# Patient Record
Sex: Female | Born: 1955
Health system: Southern US, Community
[De-identification: ages and names within clinical notes are randomized; demographics above are authoritative.]

## PROBLEM LIST (undated history)

## (undated) DIAGNOSIS — Z952 Presence of prosthetic heart valve: Secondary | ICD-10-CM

## (undated) DIAGNOSIS — I340 Nonrheumatic mitral (valve) insufficiency: Secondary | ICD-10-CM

## (undated) DIAGNOSIS — G4700 Insomnia, unspecified: Secondary | ICD-10-CM

## (undated) DIAGNOSIS — I10 Essential (primary) hypertension: Secondary | ICD-10-CM

## (undated) DIAGNOSIS — I471 Supraventricular tachycardia: Secondary | ICD-10-CM

## (undated) DIAGNOSIS — R000 Tachycardia, unspecified: Secondary | ICD-10-CM

## (undated) DIAGNOSIS — F419 Anxiety disorder, unspecified: Secondary | ICD-10-CM

## (undated) DIAGNOSIS — M1990 Unspecified osteoarthritis, unspecified site: Secondary | ICD-10-CM

## (undated) DIAGNOSIS — I341 Nonrheumatic mitral (valve) prolapse: Secondary | ICD-10-CM

## (undated) DIAGNOSIS — M8580 Other specified disorders of bone density and structure, unspecified site: Secondary | ICD-10-CM

## (undated) HISTORY — DX: Nonrheumatic mitral (valve) prolapse: I34.1

## (undated) HISTORY — DX: Supraventricular tachycardia: I47.1

## (undated) HISTORY — DX: Essential (primary) hypertension: I10

## (undated) HISTORY — DX: Unspecified osteoarthritis, unspecified site: M19.90

## (undated) HISTORY — DX: Other specified disorders of bone density and structure, unspecified site: M85.80

## (undated) HISTORY — DX: Presence of prosthetic heart valve: Z95.2

## (undated) HISTORY — PX: BREAST SURGERY: SHX581

## (undated) HISTORY — DX: Insomnia, unspecified: G47.00

## (undated) HISTORY — PX: AUGMENTATION MAMMAPLASTY: SUR837

---

## 1998-10-19 ENCOUNTER — Other Ambulatory Visit: Admission: RE | Admit: 1998-10-19 | Discharge: 1998-10-19 | Payer: Self-pay | Admitting: Obstetrics & Gynecology

## 1999-11-08 ENCOUNTER — Other Ambulatory Visit: Admission: RE | Admit: 1999-11-08 | Discharge: 1999-11-08 | Payer: Self-pay | Admitting: Obstetrics and Gynecology

## 2000-10-14 ENCOUNTER — Other Ambulatory Visit: Admission: RE | Admit: 2000-10-14 | Discharge: 2000-10-14 | Payer: Self-pay | Admitting: *Deleted

## 2001-11-05 ENCOUNTER — Other Ambulatory Visit: Admission: RE | Admit: 2001-11-05 | Discharge: 2001-11-05 | Payer: Self-pay | Admitting: *Deleted

## 2002-11-25 ENCOUNTER — Other Ambulatory Visit: Admission: RE | Admit: 2002-11-25 | Discharge: 2002-11-25 | Payer: Self-pay | Admitting: *Deleted

## 2003-12-19 ENCOUNTER — Other Ambulatory Visit: Admission: RE | Admit: 2003-12-19 | Discharge: 2003-12-19 | Payer: Self-pay | Admitting: *Deleted

## 2005-01-30 ENCOUNTER — Encounter: Admission: RE | Admit: 2005-01-30 | Discharge: 2005-01-30 | Payer: Self-pay | Admitting: Internal Medicine

## 2006-05-05 ENCOUNTER — Encounter: Admission: RE | Admit: 2006-05-05 | Discharge: 2006-05-05 | Payer: Self-pay | Admitting: Obstetrics & Gynecology

## 2007-05-20 ENCOUNTER — Encounter: Admission: RE | Admit: 2007-05-20 | Discharge: 2007-05-20 | Payer: Self-pay | Admitting: Internal Medicine

## 2008-04-27 ENCOUNTER — Ambulatory Visit (HOSPITAL_BASED_OUTPATIENT_CLINIC_OR_DEPARTMENT_OTHER): Admission: RE | Admit: 2008-04-27 | Discharge: 2008-04-27 | Payer: Self-pay | Admitting: Orthopedic Surgery

## 2008-05-20 ENCOUNTER — Encounter: Admission: RE | Admit: 2008-05-20 | Discharge: 2008-05-20 | Payer: Self-pay | Admitting: Obstetrics & Gynecology

## 2008-07-22 ENCOUNTER — Encounter (INDEPENDENT_AMBULATORY_CARE_PROVIDER_SITE_OTHER): Payer: Self-pay | Admitting: Cardiology

## 2008-07-22 ENCOUNTER — Ambulatory Visit (HOSPITAL_COMMUNITY): Admission: RE | Admit: 2008-07-22 | Discharge: 2008-07-22 | Payer: Self-pay | Admitting: Cardiology

## 2008-08-19 HISTORY — PX: MITRAL VALVE REPAIR: SHX2039

## 2009-05-22 ENCOUNTER — Encounter: Admission: RE | Admit: 2009-05-22 | Discharge: 2009-05-22 | Payer: Self-pay | Admitting: Obstetrics & Gynecology

## 2010-05-28 ENCOUNTER — Encounter: Admission: RE | Admit: 2010-05-28 | Discharge: 2010-05-28 | Payer: Self-pay | Admitting: Obstetrics & Gynecology

## 2010-09-09 ENCOUNTER — Encounter: Payer: Self-pay | Admitting: Obstetrics & Gynecology

## 2010-09-10 ENCOUNTER — Encounter: Payer: Self-pay | Admitting: Obstetrics & Gynecology

## 2011-01-01 NOTE — Op Note (Signed)
[name], April Robertson               ACCOUNT NO.:  [id number]   MEDICAL RECORD NO.:  [id number]          PATIENT TYPE:  AMB   LOCATION:  DSC                          FACILITY:  [facility]   PHYSICIAN:  Harvie Junior, M.D.   DATE OF BIRTH:  03-Sep-1955   DATE OF PROCEDURE:  04/27/2008  DATE OF DISCHARGE:                               OPERATIVE REPORT   PREOPERATIVE DIAGNOSES:  1. Patellofemoral degenerative joint disease with suspected medial      meniscal tear.  2. Impingement of right shoulder.   POSTOPERATIVE DIAGNOSES:  1. Medial meniscal tear.  2. Severe chondromalacia of the medial femoral condyle, medial tibial      plateau.  3. Cartilaginous loose body.  4. Severe chondromalacia of the patellofemoral joint.   OPERATIONS AND PROCEDURES:  1. Partial posterior horn medial meniscectomy with corresponding      debridement in the medial compartment.  2. Chondroplasty of the patellofemoral joint including the trochlea      and the anterior aspect of the femoral condyle.  3. Removal of osteocartilaginous loose body.  4. Injection of 6 of Xylocaine, 2 of Depo-Medrol into the right      subacromial space.   SURGEON:  Harvie Junior, MD   ASSISTANT:  Marshia Ly, PA   ANESTHESIA:  General.   BRIEF HISTORY:  The patient is a 55 year old female with long history  having had significant pain in the right knee.  We evaluated previously  noted to have significant chondromalacia issues with range of motion of  the knee.  She had come back to [place] because she had a locking episode in  her knee and was suspected that she had a loose body.  We talked about  treatment options and we felt the most appropriate course of action was  going to be debridement of the knee arthroscopically and she was brought  to the operating room for this procedure.   PROCEDURE:  The patient brought to the operating room after adequate  anesthesia was obtained under general anesthetic.  The patient was  placed  supine on the operating table.  The right leg was then prepped  and draped in usual sterile fashion.  Following this, routine  arthroscopy examination of the knee revealed that there was an obvious  chondromalacia of the medial patella facet as well as the medial femoral  condyle in the patellofemoral trochlea.  The grade IV issues were seen  significantly over that area and this was debrided back to smooth and  stable rim.  Attention was then turned on to medial compartment, there  was a large medial shelf plica, which was debrided.  Attention was  further turned on the medial compartment where there was fairly  significant wear on the medial femoral condyle as well as an area of  grade IV change in the medial tibial plateau, which were debrided.  There was a posterior horn medial meniscal tear back at the meniscal  root and this was debrided.  Once this was completed, attention was  turned towards the ACL, which was normal lateral side showed no  degenerative changes.  At this point, attention was turned back to  medial compartment a large cartilaginous loose body came into the knee  it was about 5-mm in size and this was removed with a grasper and taken  to the back table.  The knee was then again thoroughly and copiously  irrigated and then suction dry.  Final check was made for loosening  fragment and pieces, seen none.  The knee was thoroughly irrigated one  final time, and the patient was then taken to recovery and noted to be  satisfactory condition.  The portals were closed with a bandage.  Sterile compression dressing was applied prior to being taken to the  recovery room.      Harvie Junior, M.D.  Electronically Signed     JLG/MEDQ  D:  04/27/2008  T:  04/28/2008  Job:  [id number]

## 2011-04-24 ENCOUNTER — Other Ambulatory Visit: Payer: Self-pay | Admitting: Obstetrics & Gynecology

## 2011-04-24 DIAGNOSIS — Z1231 Encounter for screening mammogram for malignant neoplasm of breast: Secondary | ICD-10-CM

## 2011-05-22 LAB — BASIC METABOLIC PANEL
BUN: 18
CO2: 23
Calcium: 9.4
Chloride: 100
Creatinine, Ser: 0.68
GFR calc Af Amer: >60
GFR calc non Af Amer: >60
Glucose, Bld: 98
Potassium: 4.2
Sodium: 134 — ABNORMAL LOW

## 2011-05-22 LAB — POCT HEMOGLOBIN-HEMACUE: Hemoglobin: 13.3

## 2011-05-30 ENCOUNTER — Ambulatory Visit
Admission: RE | Admit: 2011-05-30 | Discharge: 2011-05-30 | Disposition: A | Discharge status: Home / Self Care | Payer: BC Managed Care – PPO | Source: Ambulatory Visit | Attending: Obstetrics & Gynecology | Admitting: Obstetrics & Gynecology

## 2011-05-30 DIAGNOSIS — Z1231 Encounter for screening mammogram for malignant neoplasm of breast: Secondary | ICD-10-CM

## 2012-05-04 ENCOUNTER — Other Ambulatory Visit: Payer: Self-pay | Admitting: Orthopaedic Surgery

## 2012-05-04 DIAGNOSIS — M549 Dorsalgia, unspecified: Secondary | ICD-10-CM

## 2012-05-05 ENCOUNTER — Other Ambulatory Visit: Payer: Self-pay | Admitting: Obstetrics & Gynecology

## 2012-05-05 DIAGNOSIS — Z1231 Encounter for screening mammogram for malignant neoplasm of breast: Secondary | ICD-10-CM

## 2012-05-06 ENCOUNTER — Ambulatory Visit
Admission: RE | Admit: 2012-05-06 | Discharge: 2012-05-06 | Disposition: A | Discharge status: Home / Self Care | Payer: PRIVATE HEALTH INSURANCE | Source: Ambulatory Visit | Attending: Orthopaedic Surgery | Admitting: Orthopaedic Surgery

## 2012-05-06 DIAGNOSIS — M549 Dorsalgia, unspecified: Secondary | ICD-10-CM

## 2012-06-02 ENCOUNTER — Ambulatory Visit
Admission: RE | Admit: 2012-06-02 | Discharge: 2012-06-02 | Disposition: A | Discharge status: Home / Self Care | Payer: PRIVATE HEALTH INSURANCE | Source: Ambulatory Visit | Attending: Obstetrics & Gynecology | Admitting: Obstetrics & Gynecology

## 2012-06-02 DIAGNOSIS — Z1231 Encounter for screening mammogram for malignant neoplasm of breast: Secondary | ICD-10-CM

## 2012-10-30 ENCOUNTER — Other Ambulatory Visit: Payer: Self-pay | Admitting: Orthopedic Surgery

## 2012-11-03 ENCOUNTER — Other Ambulatory Visit: Payer: Self-pay | Admitting: Orthopedic Surgery

## 2012-11-13 NOTE — Pre-Procedure Instructions (Signed)
Tiaja Hagan  11/13/2012   Your procedure is scheduled on:  Monday, April 7th  Report to [facility] at 1030 AM.  Call this number if you have problems the morning of surgery: [phone]   Remember:   Do not eat food or drink liquids after midnight.    Take these medicines the morning of surgery with A SIP OF WATER:   Do not wear jewelry, make-up or nail polish.  Do not wear lotions, powders, or perfumes,deodorant.  Do not shave 48 hours prior to surgery. Men may shave face and neck.  Do not bring valuables to the hospital.  Contacts, dentures or bridgework may not be worn into surgery.  Leave suitcase in the car. After surgery it may be brought to your room.  For patients admitted to the hospital, checkout time is 11:00 AM the day of discharge.   Patients discharged the day of surgery will not be allowed to drive home.    Special Instructions: Shower using CHG 2 nights before surgery and the night before surgery.  If you shower the day of surgery use CHG.  Use special wash - you have one bottle of CHG for all showers.  You should use approximately 1/3 of the bottle for each shower.   Please read over the following fact sheets that you were given: Pain Booklet, Coughing and Deep Breathing, Blood Transfusion Information, MRSA Information and Surgical Site Infection Prevention

## 2012-11-16 ENCOUNTER — Ambulatory Visit (HOSPITAL_COMMUNITY)
Admission: RE | Admit: 2012-11-16 | Discharge: 2012-11-16 | Disposition: A | Discharge status: Home / Self Care | Payer: Managed Care, Other (non HMO) | Source: Ambulatory Visit | Attending: Orthopedic Surgery | Admitting: Orthopedic Surgery

## 2012-11-16 ENCOUNTER — Encounter (HOSPITAL_COMMUNITY): Payer: Self-pay | Admitting: Pharmacy Technician

## 2012-11-16 ENCOUNTER — Encounter (HOSPITAL_COMMUNITY)
Admission: RE | Admit: 2012-11-16 | Discharge: 2012-11-16 | Disposition: A | Discharge status: Home / Self Care | Payer: Managed Care, Other (non HMO) | Source: Ambulatory Visit | Attending: Orthopedic Surgery | Admitting: Orthopedic Surgery

## 2012-11-16 ENCOUNTER — Encounter (HOSPITAL_COMMUNITY): Payer: Self-pay

## 2012-11-16 DIAGNOSIS — I10 Essential (primary) hypertension: Secondary | ICD-10-CM | POA: Insufficient documentation

## 2012-11-16 DIAGNOSIS — Z01818 Encounter for other preprocedural examination: Secondary | ICD-10-CM | POA: Insufficient documentation

## 2012-11-16 DIAGNOSIS — Z01810 Encounter for preprocedural cardiovascular examination: Secondary | ICD-10-CM | POA: Insufficient documentation

## 2012-11-16 DIAGNOSIS — F411 Generalized anxiety disorder: Secondary | ICD-10-CM | POA: Insufficient documentation

## 2012-11-16 DIAGNOSIS — Z01812 Encounter for preprocedural laboratory examination: Secondary | ICD-10-CM | POA: Insufficient documentation

## 2012-11-16 HISTORY — DX: Tachycardia, unspecified: R00.0

## 2012-11-16 HISTORY — DX: Nonrheumatic mitral (valve) insufficiency: I34.0

## 2012-11-16 HISTORY — DX: Anxiety disorder, unspecified: F41.9

## 2012-11-16 LAB — COMPREHENSIVE METABOLIC PANEL WITH GFR
ALT: 30 U/L (ref 0–35)
AST: 34 U/L (ref 0–37)
Albumin: 4 g/dL (ref 3.5–5.2)
Alkaline Phosphatase: 80 U/L (ref 39–117)
BUN: 21 mg/dL (ref 6–23)
CO2: 28 meq/L (ref 19–32)
Calcium: 9.5 mg/dL (ref 8.4–10.5)
Chloride: 99 meq/L (ref 96–112)
Creatinine, Ser: 0.6 mg/dL (ref 0.50–1.10)
GFR calc Af Amer: >90 mL/min
GFR calc non Af Amer: >90 mL/min
Glucose, Bld: 95 mg/dL (ref 70–99)
Potassium: 3.6 meq/L (ref 3.5–5.1)
Sodium: 138 meq/L (ref 135–145)
Total Bilirubin: 0.7 mg/dL (ref 0.3–1.2)
Total Protein: 7.5 g/dL (ref 6.0–8.3)

## 2012-11-16 LAB — SURGICAL PCR SCREEN
MRSA, PCR: NEGATIVE
Staphylococcus aureus: NEGATIVE

## 2012-11-16 LAB — ABO/RH: ABO/RH(D): O POS

## 2012-11-16 LAB — URINALYSIS, ROUTINE W REFLEX MICROSCOPIC
Bilirubin Urine: NEGATIVE
Glucose, UA: NEGATIVE mg/dL
Ketones, ur: NEGATIVE mg/dL
Nitrite: POSITIVE — AB
Protein, ur: NEGATIVE mg/dL
Specific Gravity, Urine: 1.024 (ref 1.005–1.030)
Urobilinogen, UA: 1 mg/dL (ref 0.0–1.0)
pH: 5.5 (ref 5.0–8.0)

## 2012-11-16 LAB — CBC WITH DIFFERENTIAL/PLATELET
Basophils Absolute: 0 K/uL (ref 0.0–0.1)
Basophils Relative: 1 % (ref 0–1)
Eosinophils Absolute: 0.1 K/uL (ref 0.0–0.7)
Eosinophils Relative: 1 % (ref 0–5)
HCT: 38.8 % (ref 36.0–46.0)
Hemoglobin: 13.6 g/dL (ref 12.0–15.0)
Lymphocytes Relative: 42 % (ref 12–46)
Lymphs Abs: 2.7 K/uL (ref 0.7–4.0)
MCH: 34.4 pg — ABNORMAL HIGH (ref 26.0–34.0)
MCHC: 35.1 g/dL (ref 30.0–36.0)
MCV: 98.2 fL (ref 78.0–100.0)
Monocytes Absolute: 0.6 K/uL (ref 0.1–1.0)
Monocytes Relative: 9 % (ref 3–12)
Neutro Abs: 3 K/uL (ref 1.7–7.7)
Neutrophils Relative %: 47 % (ref 43–77)
Platelets: 206 K/uL (ref 150–400)
RBC: 3.95 MIL/uL (ref 3.87–5.11)
RDW: 13.1 % (ref 11.5–15.5)
WBC: 6.3 K/uL (ref 4.0–10.5)

## 2012-11-16 LAB — URINE MICROSCOPIC-ADD ON

## 2012-11-16 LAB — PROTIME-INR
INR: 0.95 (ref 0.00–1.49)
Prothrombin Time: 12.6 s (ref 11.6–15.2)

## 2012-11-16 LAB — TYPE AND SCREEN
ABO/RH(D): O POS
Antibody Screen: NEGATIVE

## 2012-11-16 LAB — APTT: aPTT: 32 seconds (ref 24–37)

## 2012-11-16 NOTE — Progress Notes (Signed)
Primary Physician - Dr. Kirby Funk Cardiologist - Dr. Cloria Spring, echo, ekg, last office visit - will request records

## 2012-11-17 NOTE — Progress Notes (Signed)
Anesthesia chart review: Patient is a 57 year old female scheduled for right TKA by Dr. Luiz Blare on 11/23/2012.  History includes non-smoker, anxiety, tachycardia (SVT), MVP s/p complex MV repair '10 (Glower ring placement with 2 artificial cord repairs).  Cardiology notes also mention a history of HTN. PCP is Dr. Kirby Funk.  Cardiologist is Dr. Verdis Prime.  He is aware of planned procedure and stated, "No significant cardiac concerns for the proposed right total knee replacement."  EKG on 11/16/12 showed NSR, possible LAE, septal infarct (age undetermined), borderline LAD.  Borderline LAD is new, but otherwise I think her EKG is stable since 05/13/12 Deboraha Sprang).  Echo on 05/16/10 Deboraha Sprang) showed: Normal wall thickness, chamber size, and contraction. Left ventricular ejection fraction estimated at 78%. Moderate left atrial enlargement. Stable repair, mitral annular ring in place. Mild to moderate mitral valve regurgitation. Mild tricuspid regurgitation.  Cardiac cath on 09/09/08 (Dr. Corliss Marcus) showed: Normal right heart pressures. Normal cardiac outputs. Severe mitral regurgitation. Hyperdynamic LV systolic function. EF 75-80%. Normal coronary arteries.  CXR report on 11/16/12 showed: Surgical clips right upper lobe. Surgical wire device in the region of the mitral valve. Heart size is normal. Negative for heart failure. Lungs are clear without infiltrate or effusion. Negative for mass lesion. IMPRESSION: Postop changes. No acute abnormality.  Pre-operative labs noted.  Preliminary urine culture showed E. Coli--results called to Darl Pikes at Dr. Luiz Blare' office.  Defer treatment recommendations to Dr. Luiz Blare.  If no acute changes then would anticipate she would be okay to proceed from an anesthesia standpoint.  She will be evaluated by her assigned anesthesiologist on the day of surgery.  Velna [facility]/Anesthesiology Phone [phone] 11/17/2012 11:19 AM

## 2012-11-18 LAB — URINE CULTURE: Colony Count: >=100000

## 2012-11-22 MED ORDER — CEFAZOLIN SODIUM-DEXTROSE 2-3 GM-% IV SOLR
2.0000 g | INTRAVENOUS | Status: AC
  Administered 2012-11-23: 2 g via INTRAVENOUS
  Filled 2012-11-22: qty 50

## 2012-11-23 ENCOUNTER — Encounter (HOSPITAL_COMMUNITY): Payer: Self-pay | Admitting: *Deleted

## 2012-11-23 ENCOUNTER — Encounter (HOSPITAL_COMMUNITY): Payer: Self-pay | Admitting: Vascular Surgery

## 2012-11-23 ENCOUNTER — Ambulatory Visit (HOSPITAL_COMMUNITY): Payer: Managed Care, Other (non HMO) | Admitting: Anesthesiology

## 2012-11-23 ENCOUNTER — Inpatient Hospital Stay (HOSPITAL_COMMUNITY)
Admission: RE | Admit: 2012-11-23 | Discharge: 2012-11-25 | DRG: 470 | Disposition: A | Discharge status: Home / Self Care | Payer: Managed Care, Other (non HMO) | Source: Ambulatory Visit | Attending: Orthopedic Surgery | Admitting: Orthopedic Surgery

## 2012-11-23 ENCOUNTER — Encounter (HOSPITAL_COMMUNITY)
Admission: RE | Disposition: A | Discharge status: Home / Self Care | Payer: Self-pay | Source: Ambulatory Visit | Attending: Orthopedic Surgery

## 2012-11-23 DIAGNOSIS — M1711 Unilateral primary osteoarthritis, right knee: Secondary | ICD-10-CM | POA: Diagnosis present

## 2012-11-23 DIAGNOSIS — Z79899 Other long term (current) drug therapy: Secondary | ICD-10-CM

## 2012-11-23 DIAGNOSIS — M1710 Unilateral primary osteoarthritis, unspecified knee: Principal | ICD-10-CM | POA: Diagnosis present

## 2012-11-23 DIAGNOSIS — Z7982 Long term (current) use of aspirin: Secondary | ICD-10-CM

## 2012-11-23 DIAGNOSIS — I059 Rheumatic mitral valve disease, unspecified: Secondary | ICD-10-CM | POA: Diagnosis present

## 2012-11-23 DIAGNOSIS — F411 Generalized anxiety disorder: Secondary | ICD-10-CM | POA: Diagnosis present

## 2012-11-23 HISTORY — PX: KNEE ARTHROPLASTY: SHX992

## 2012-11-23 SURGERY — ARTHROPLASTY, KNEE, TOTAL, USING IMAGELESS COMPUTER-ASSISTED NAVIGATION
Anesthesia: Regional | Site: Knee | Laterality: Right | Wound class: Clean

## 2012-11-23 MED ORDER — MIDAZOLAM HCL 2 MG/2ML IJ SOLN
INTRAMUSCULAR | Status: AC
  Administered 2012-11-23: 2 mg
  Filled 2012-11-23: qty 2

## 2012-11-23 MED ORDER — CEFAZOLIN SODIUM-DEXTROSE 2-3 GM-% IV SOLR
2.0000 g | Freq: Four times a day (QID) | INTRAVENOUS | Status: AC
  Administered 2012-11-23 (×2): 2 g via INTRAVENOUS
  Filled 2012-11-23 (×3): qty 50

## 2012-11-23 MED ORDER — DOCUSATE SODIUM 100 MG PO CAPS
100.0000 mg | ORAL_CAPSULE | Freq: Two times a day (BID) | ORAL | Status: DC
  Administered 2012-11-23 – 2012-11-25 (×5): 100 mg via ORAL
  Filled 2012-11-23 (×6): qty 1

## 2012-11-23 MED ORDER — ACETAMINOPHEN 10 MG/ML IV SOLN
INTRAVENOUS | Status: AC
  Administered 2012-11-23: 1000 mg via INTRAVENOUS
  Filled 2012-11-23: qty 100

## 2012-11-23 MED ORDER — SCOPOLAMINE 1 MG/3DAYS TD PT72
MEDICATED_PATCH | TRANSDERMAL | Status: AC
  Administered 2012-11-23: 1.5 mg via TRANSDERMAL
  Filled 2012-11-23: qty 1

## 2012-11-23 MED ORDER — GLYCOPYRROLATE 0.2 MG/ML IJ SOLN
INTRAMUSCULAR | Status: DC | PRN
  Administered 2012-11-23: .4 mg via INTRAVENOUS

## 2012-11-23 MED ORDER — HYDROMORPHONE HCL PF 1 MG/ML IJ SOLN
INTRAMUSCULAR | Status: AC
  Filled 2012-11-23: qty 1

## 2012-11-23 MED ORDER — ONDANSETRON HCL 4 MG/2ML IJ SOLN
INTRAMUSCULAR | Status: DC | PRN
  Administered 2012-11-23: 4 mg via INTRAVENOUS

## 2012-11-23 MED ORDER — METOCLOPRAMIDE HCL 5 MG/ML IJ SOLN: 10.0000 mg | Freq: Once | INTRAMUSCULAR | Status: DC | PRN

## 2012-11-23 MED ORDER — OXYCODONE HCL 5 MG PO TABS: 5.0000 mg | ORAL_TABLET | Freq: Once | ORAL | Status: DC | PRN

## 2012-11-23 MED ORDER — FENTANYL CITRATE 0.05 MG/ML IJ SOLN
INTRAMUSCULAR | Status: DC | PRN
  Administered 2012-11-23: 100 ug via INTRAVENOUS
  Administered 2012-11-23 (×2): 50 ug via INTRAVENOUS
  Administered 2012-11-23: 100 ug via INTRAVENOUS
  Administered 2012-11-23 (×2): 50 ug via INTRAVENOUS

## 2012-11-23 MED ORDER — CEFUROXIME SODIUM 1.5 G IJ SOLR
INTRAMUSCULAR | Status: AC
  Filled 2012-11-23: qty 1.5

## 2012-11-23 MED ORDER — MIDAZOLAM HCL 2 MG/2ML IJ SOLN: 1.0000 mg | INTRAMUSCULAR | Status: DC | PRN

## 2012-11-23 MED ORDER — ACETAMINOPHEN 10 MG/ML IV SOLN
1000.0000 mg | Freq: Four times a day (QID) | INTRAVENOUS | Status: AC
  Administered 2012-11-23 – 2012-11-24 (×3): 1000 mg via INTRAVENOUS
  Filled 2012-11-23 (×4): qty 100

## 2012-11-23 MED ORDER — KETOROLAC TROMETHAMINE 15 MG/ML IJ SOLN
15.0000 mg | Freq: Four times a day (QID) | INTRAMUSCULAR | Status: AC
  Administered 2012-11-23 – 2012-11-24 (×4): 15 mg via INTRAVENOUS
  Filled 2012-11-23 (×4): qty 1

## 2012-11-23 MED ORDER — PROPOFOL 10 MG/ML IV BOLUS
INTRAVENOUS | Status: DC | PRN
  Administered 2012-11-23: 170 mg via INTRAVENOUS

## 2012-11-23 MED ORDER — LACTATED RINGERS IV SOLN
INTRAVENOUS | Status: DC | PRN
  Administered 2012-11-23 (×2): via INTRAVENOUS

## 2012-11-23 MED ORDER — POLYETHYLENE GLYCOL 3350 17 G PO PACK: 17.0000 g | PACK | Freq: Every day | ORAL | Status: DC | PRN

## 2012-11-23 MED ORDER — ALUM & MAG HYDROXIDE-SIMETH 200-200-20 MG/5ML PO SUSP: 30.0000 mL | ORAL | Status: DC | PRN

## 2012-11-23 MED ORDER — HYDROMORPHONE HCL PF 1 MG/ML IJ SOLN
0.2500 mg | INTRAMUSCULAR | Status: DC | PRN
  Administered 2012-11-23 (×4): 0.5 mg via INTRAVENOUS

## 2012-11-23 MED ORDER — FENTANYL CITRATE 0.05 MG/ML IJ SOLN
50.0000 ug | INTRAMUSCULAR | Status: DC | PRN
  Administered 2012-11-23: 100 ug via INTRAVENOUS

## 2012-11-23 MED ORDER — ROCURONIUM BROMIDE 100 MG/10ML IV SOLN
INTRAVENOUS | Status: DC | PRN
  Administered 2012-11-23: 50 mg via INTRAVENOUS

## 2012-11-23 MED ORDER — ASPIRIN EC 325 MG PO TBEC
325.0000 mg | DELAYED_RELEASE_TABLET | Freq: Two times a day (BID) | ORAL | Status: DC
  Administered 2012-11-23 – 2012-11-25 (×4): 325 mg via ORAL
  Filled 2012-11-23 (×6): qty 1

## 2012-11-23 MED ORDER — METHOCARBAMOL 100 MG/ML IJ SOLN: 500.0000 mg | Freq: Four times a day (QID) | INTRAMUSCULAR | Status: DC | PRN

## 2012-11-23 MED ORDER — ZOLPIDEM TARTRATE 5 MG PO TABS: 5.0000 mg | ORAL_TABLET | Freq: Every evening | ORAL | Status: DC | PRN

## 2012-11-23 MED ORDER — SCOPOLAMINE 1 MG/3DAYS TD PT72: 1.0000 | MEDICATED_PATCH | Freq: Once | TRANSDERMAL | Status: DC

## 2012-11-23 MED ORDER — METOPROLOL TARTRATE 50 MG PO TABS
75.0000 mg | ORAL_TABLET | Freq: Two times a day (BID) | ORAL | Status: DC
  Administered 2012-11-23 – 2012-11-25 (×4): 75 mg via ORAL
  Filled 2012-11-23 (×5): qty 1

## 2012-11-23 MED ORDER — FERROUS SULFATE 325 (65 FE) MG PO TABS
325.0000 mg | ORAL_TABLET | Freq: Two times a day (BID) | ORAL | Status: DC
  Administered 2012-11-23 – 2012-11-25 (×4): 325 mg via ORAL
  Filled 2012-11-23 (×6): qty 1

## 2012-11-23 MED ORDER — ONDANSETRON HCL 4 MG/2ML IJ SOLN: 4.0000 mg | Freq: Four times a day (QID) | INTRAMUSCULAR | Status: DC | PRN

## 2012-11-23 MED ORDER — LIDOCAINE HCL (CARDIAC) 20 MG/ML IV SOLN
INTRAVENOUS | Status: DC | PRN
  Administered 2012-11-23: 75 mg via INTRAVENOUS

## 2012-11-23 MED ORDER — PROMETHAZINE HCL 25 MG/ML IJ SOLN: 12.5000 mg | Freq: Four times a day (QID) | INTRAMUSCULAR | Status: DC | PRN

## 2012-11-23 MED ORDER — POVIDONE-IODINE 7.5 % EX SOLN
Freq: Once | CUTANEOUS | Status: DC
  Filled 2012-11-23: qty 118

## 2012-11-23 MED ORDER — OXYCODONE HCL 5 MG PO TABS
5.0000 mg | ORAL_TABLET | ORAL | Status: DC | PRN
  Administered 2012-11-23 – 2012-11-25 (×9): 10 mg via ORAL
  Filled 2012-11-23 (×9): qty 2

## 2012-11-23 MED ORDER — ONDANSETRON HCL 4 MG PO TABS: 4.0000 mg | ORAL_TABLET | Freq: Four times a day (QID) | ORAL | Status: DC | PRN

## 2012-11-23 MED ORDER — SODIUM CHLORIDE 0.9 % IV SOLN
INTRAVENOUS | Status: DC
  Administered 2012-11-24: 07:00:00 via INTRAVENOUS

## 2012-11-23 MED ORDER — MIDAZOLAM HCL 5 MG/5ML IJ SOLN
INTRAMUSCULAR | Status: DC | PRN
  Administered 2012-11-23 (×2): 2 mg via INTRAVENOUS

## 2012-11-23 MED ORDER — METHOCARBAMOL 500 MG PO TABS
500.0000 mg | ORAL_TABLET | Freq: Four times a day (QID) | ORAL | Status: DC | PRN
  Administered 2012-11-23 – 2012-11-25 (×5): 500 mg via ORAL
  Filled 2012-11-23 (×5): qty 1

## 2012-11-23 MED ORDER — CEFAZOLIN SODIUM-DEXTROSE 2-3 GM-% IV SOLR: 2.0000 g | INTRAVENOUS | Status: DC

## 2012-11-23 MED ORDER — SODIUM CHLORIDE 0.9 % IR SOLN
Status: DC | PRN
  Administered 2012-11-23: 3000 mL
  Administered 2012-11-23: 1000 mL

## 2012-11-23 MED ORDER — CEFUROXIME SODIUM 1.5 G IJ SOLR
INTRAMUSCULAR | Status: DC | PRN
  Administered 2012-11-23: 1.5 g

## 2012-11-23 MED ORDER — METHOCARBAMOL 500 MG PO TABS
ORAL_TABLET | ORAL | Status: AC
  Filled 2012-11-23: qty 1

## 2012-11-23 MED ORDER — ALPRAZOLAM 0.5 MG PO TABS
0.5000 mg | ORAL_TABLET | Freq: Three times a day (TID) | ORAL | Status: DC | PRN
  Administered 2012-11-24 – 2012-11-25 (×2): 0.5 mg via ORAL
  Filled 2012-11-23 (×3): qty 1

## 2012-11-23 MED ORDER — DIPHENHYDRAMINE HCL 12.5 MG/5ML PO ELIX: 12.5000 mg | ORAL_SOLUTION | ORAL | Status: DC | PRN

## 2012-11-23 MED ORDER — OXYCODONE HCL 5 MG/5ML PO SOLN: 5.0000 mg | Freq: Once | ORAL | Status: DC | PRN

## 2012-11-23 MED ORDER — HYDROMORPHONE HCL PF 1 MG/ML IJ SOLN
1.0000 mg | INTRAMUSCULAR | Status: DC | PRN
  Administered 2012-11-23 – 2012-11-24 (×4): 1 mg via INTRAVENOUS
  Filled 2012-11-23 (×5): qty 1

## 2012-11-23 MED ORDER — BUPIVACAINE-EPINEPHRINE PF 0.5-1:200000 % IJ SOLN
INTRAMUSCULAR | Status: DC | PRN
  Administered 2012-11-23: 25 mL

## 2012-11-23 MED ORDER — NEOSTIGMINE METHYLSULFATE 1 MG/ML IJ SOLN
INTRAMUSCULAR | Status: DC | PRN
  Administered 2012-11-23: 2 mg via INTRAVENOUS

## 2012-11-23 MED ORDER — FENTANYL CITRATE 0.05 MG/ML IJ SOLN
INTRAMUSCULAR | Status: AC
  Filled 2012-11-23: qty 2

## 2012-11-23 SURGICAL SUPPLY — 70 items
APL SKNCLS STERI-STRIP NONHPOA (GAUZE/BANDAGES/DRESSINGS) ×1
BANDAGE ELASTIC 6 VELCRO ST LF (GAUZE/BANDAGES/DRESSINGS) ×1 IMPLANT
BANDAGE ESMARK 6X9 LF (GAUZE/BANDAGES/DRESSINGS) ×1 IMPLANT
BENZOIN TINCTURE PRP APPL 2/3 (GAUZE/BANDAGES/DRESSINGS) ×2 IMPLANT
BLADE SAGITTAL 25.0X1.19X90 (BLADE) ×2 IMPLANT
BLADE SAW SAG 90X13X1.27 (BLADE) ×2 IMPLANT
BNDG CMPR 9X6 STRL LF SNTH (GAUZE/BANDAGES/DRESSINGS) ×1
BNDG ESMARK 6X9 LF (GAUZE/BANDAGES/DRESSINGS) ×2
BOWL SMART MIX CTS (DISPOSABLE) ×2 IMPLANT
CEMENT HV SMART SET (Cement) ×4 IMPLANT
CLOTH BEACON ORANGE TIMEOUT ST (SAFETY) ×2 IMPLANT
CLSR STERI-STRIP ANTIMIC 1/2X4 (GAUZE/BANDAGES/DRESSINGS) ×1 IMPLANT
CONT SPEC STER OR (MISCELLANEOUS) ×1 IMPLANT
COVER BACK TABLE 24X17X13 BIG (DRAPES) IMPLANT
COVER SURGICAL LIGHT HANDLE (MISCELLANEOUS) ×2 IMPLANT
CUFF TOURNIQUET SINGLE 34IN LL (TOURNIQUET CUFF) ×2 IMPLANT
CUFF TOURNIQUET SINGLE 44IN (TOURNIQUET CUFF) IMPLANT
DRAPE EXTREMITY T 121X128X90 (DRAPE) ×2 IMPLANT
DRAPE U-SHAPE 47X51 STRL (DRAPES) ×2 IMPLANT
DRSG PAD ABDOMINAL 8X10 ST (GAUZE/BANDAGES/DRESSINGS) ×3 IMPLANT
DURAPREP 26ML APPLICATOR (WOUND CARE) ×2 IMPLANT
ELECT REM PT RETURN 9FT ADLT (ELECTROSURGICAL) ×2
ELECTRODE REM PT RTRN 9FT ADLT (ELECTROSURGICAL) ×1 IMPLANT
EVACUATOR 1/8 PVC DRAIN (DRAIN) ×2 IMPLANT
FACESHIELD LNG OPTICON STERILE (SAFETY) ×2 IMPLANT
GAUZE XEROFORM 5X9 LF (GAUZE/BANDAGES/DRESSINGS) ×2 IMPLANT
GLOVE BIOGEL PI IND STRL 8 (GLOVE) ×2 IMPLANT
GLOVE BIOGEL PI INDICATOR 8 (GLOVE) ×2
GLOVE ECLIPSE 7.5 STRL STRAW (GLOVE) ×4 IMPLANT
GOWN PREVENTION PLUS XLARGE (GOWN DISPOSABLE) ×5 IMPLANT
GOWN SRG XL XLNG 56XLVL 4 (GOWN DISPOSABLE) ×1 IMPLANT
GOWN STRL NON-REIN LRG LVL3 (GOWN DISPOSABLE) ×1 IMPLANT
GOWN STRL NON-REIN XL XLG LVL4 (GOWN DISPOSABLE) ×2
HANDPIECE INTERPULSE COAX TIP (DISPOSABLE) ×2
HOOD PEEL AWAY FACE SHEILD DIS (HOOD) ×6 IMPLANT
IMMOBILIZER KNEE 20 (SOFTGOODS)
IMMOBILIZER KNEE 20 THIGH 36 (SOFTGOODS) IMPLANT
IMMOBILIZER KNEE 22 UNIV (SOFTGOODS) ×2 IMPLANT
IMMOBILIZER KNEE 24 THIGH 36 (MISCELLANEOUS) IMPLANT
IMMOBILIZER KNEE 24 UNIV (MISCELLANEOUS)
KIT BASIN OR (CUSTOM PROCEDURE TRAY) ×2 IMPLANT
KIT ROOM TURNOVER OR (KITS) ×2 IMPLANT
MANIFOLD NEPTUNE II (INSTRUMENTS) ×2 IMPLANT
MARKER SPHERE PSV REFLC THRD 5 (MARKER) ×6 IMPLANT
NDL HYPO 25GX1X1/2 BEV (NEEDLE) IMPLANT
NEEDLE HYPO 25GX1X1/2 BEV (NEEDLE) IMPLANT
NS IRRIG 1000ML POUR BTL (IV SOLUTION) ×2 IMPLANT
PACK TOTAL JOINT (CUSTOM PROCEDURE TRAY) ×2 IMPLANT
PAD ARMBOARD 7.5X6 YLW CONV (MISCELLANEOUS) ×4 IMPLANT
PAD CAST 4YDX4 CTTN HI CHSV (CAST SUPPLIES) ×1 IMPLANT
PADDING CAST COTTON 4X4 STRL (CAST SUPPLIES) ×2
PADDING CAST COTTON 6X4 STRL (CAST SUPPLIES) ×1 IMPLANT
PIN SCHANZ 4MM 130MM (PIN) ×4 IMPLANT
SET HNDPC FAN SPRY TIP SCT (DISPOSABLE) ×1 IMPLANT
SPONGE GAUZE 4X4 12PLY (GAUZE/BANDAGES/DRESSINGS) ×2 IMPLANT
STAPLER VISISTAT 35W (STAPLE) IMPLANT
STRIP CLOSURE SKIN 1/2X4 (GAUZE/BANDAGES/DRESSINGS) ×2 IMPLANT
SUCTION FRAZIER TIP 10 FR DISP (SUCTIONS) ×2 IMPLANT
SUT MNCRL AB 3-0 PS2 18 (SUTURE) ×1 IMPLANT
SUT MON AB 3-0 SH 27 (SUTURE) ×2
SUT MON AB 3-0 SH27 (SUTURE) IMPLANT
SUT VIC AB 0 CTB1 27 (SUTURE) ×4 IMPLANT
SUT VIC AB 1 CT1 27 (SUTURE) ×4
SUT VIC AB 1 CT1 27XBRD ANBCTR (SUTURE) ×2 IMPLANT
SUT VIC AB 2-0 CTB1 (SUTURE) ×4 IMPLANT
SYR CONTROL 10ML LL (SYRINGE) ×1 IMPLANT
TOWEL OR 17X24 6PK STRL BLUE (TOWEL DISPOSABLE) ×2 IMPLANT
TOWEL OR 17X26 10 PK STRL BLUE (TOWEL DISPOSABLE) ×2 IMPLANT
TRAY FOLEY CATH 14FR (SET/KITS/TRAYS/PACK) ×2 IMPLANT
WATER STERILE IRR 1000ML POUR (IV SOLUTION) ×4 IMPLANT

## 2012-11-23 NOTE — Progress Notes (Signed)
DR. Noreene Larsson called requested a sign out

## 2012-11-23 NOTE — Anesthesia Postprocedure Evaluation (Signed)
  Anesthesia Post-op Note  Patient: April Robertson  Procedure(s) Performed: Procedure(s) with comments: COMPUTER ASSISTED TOTAL KNEE ARTHROPLASTY (Right) - GENERAL WITH PRE OP FEMORALL NERVE BLOCK  Patient Location: PACU  Anesthesia Type:General and GA combined with regional for post-op pain  Level of Consciousness: awake, alert  and oriented  Airway and Oxygen Therapy: Patient Spontanous Breathing  Post-op Pain: mild  Post-op Assessment: Post-op Vital signs reviewed, Patient's Cardiovascular Status Stable, Respiratory Function Stable, Patent Airway and Pain level controlled  Post-op Vital Signs: stable  Complications: No apparent anesthesia complications

## 2012-11-23 NOTE — Progress Notes (Signed)
Pa visited with patient, April Robertson

## 2012-11-23 NOTE — H&P (Addendum)
TOTAL KNEE ADMISSION H&P  Patient is being admitted for right total knee arthroplasty.  Subjective:  Chief Complaint:right knee pain.  HPI: April Robertson, 57 y.o. female, has a history of pain and functional disability in the right knee due to arthritis and has failed non-surgical conservative treatments for greater than 12 weeks to includeNSAID's and/or analgesics, corticosteriod injections, viscosupplementation injections, flexibility and strengthening excercises, supervised PT with diminished ADL's post treatment, use of assistive devices and activity modification.  Onset of symptoms was gradual, starting 5 years ago with gradually worsening course since that time. The patient noted prior procedures on the knee to include  arthroscopy and menisectomy on the right knee(s).  Patient currently rates pain in the right knee(s) at 7 out of 10 with activity. Patient has night pain, worsening of pain with activity and weight bearing, pain that interferes with activities of daily living, pain with passive range of motion, crepitus and joint swelling.  Patient has evidence of periarticular osteophytes and joint space narrowing by imaging studies. This patient has had failure of conservative care. There is no active infection.  There are no active problems to display for this patient.  Past Medical History  Diagnosis Date  . Tachycardia   . Anxiety   . Mitral valve regurgitation     Past Surgical History  Procedure Laterality Date  . Mitral valve repair  2010    minimally invasive  . Breast surgery      bilateral breast implants    Prescriptions prior to admission  Medication Sig Dispense Refill  . ALPRAZolam (XANAX) 0.5 MG tablet Take 0.5 mg by mouth 3 (three) times daily as needed for anxiety.      . diphenhydrAMINE (BENADRYL) 25 MG tablet Take 12.5 mg by mouth at bedtime as needed for sleep.      Marland Kitchen ibuprofen (ADVIL,MOTRIN) 200 MG tablet Take 600 mg by mouth 2 (two) times daily.      .  metoprolol (LOPRESSOR) 50 MG tablet Take 75 mg by mouth 2 (two) times daily.      . traMADol (ULTRAM) 50 MG tablet Take 50 mg by mouth 2 (two) times daily.       No Known Allergies  History  Substance Use Topics  . Smoking status: Never Smoker   . Smokeless tobacco: Not on file  . Alcohol Use: 1.2 oz/week    2 Glasses of wine per week    History reviewed. No pertinent family history.   ROS  Objective:  Physical Exam  Vital signs in last 24 hours: Temp:  [97.7 F (36.5 C)] 97.7 F (36.5 C) (04/07 1038) Pulse Rate:  [81-87] 84 (04/07 1223) Resp:  [20] 20 (04/07 1223) BP: (132-145)/(78-92) 137/78 mmHg (04/07 1223) SpO2:  [100 %] 100 % (04/07 1223)  Labs: Pre Op Urine cx Positive for >100,000 colonies E.Coli  There is no weight on file to calculate BMI.   Imaging Review Plain radiographs demonstrate severe degenerative joint disease of the right knee(s). The overall alignment ismild varus. The bone quality appears to be good for age and reported activity level.  Assessment/Plan:  End stage arthritis, right knee  Pre Op UTI  Treated with Oral Keflex x 5 days pre operatively.  The patient history, physical examination, clinical judgment of the provider and imaging studies are consistent with end stage degenerative joint disease of the right knee(s) and total knee arthroplasty is deemed medically necessary. The treatment options including medical management, injection therapy arthroscopy and arthroplasty were discussed at  length. The risks and benefits of total knee arthroplasty were presented and reviewed. The risks due to aseptic loosening, infection, stiffness, patella tracking problems, thromboembolic complications and other imponderables were discussed. The patient acknowledged the explanation, agreed to proceed with the plan and consent was signed. Patient is being admitted for inpatient treatment for surgery, pain control, PT, OT, prophylactic antibiotics, VTE prophylaxis,  progressive ambulation and ADL's and discharge planning. The patient is planning to be discharged home with home health services

## 2012-11-23 NOTE — Anesthesia Procedure Notes (Addendum)
Anesthesia Regional Block:  Femoral nerve block  Pre-Anesthetic Checklist: ,, timeout performed, Correct Patient, Correct Site, Correct Laterality, Correct Procedure, Correct Position, site marked, Risks and benefits discussed,  Surgical consent,  Pre-op evaluation,  At surgeon's request and post-op pain management  Laterality: Right  Prep: chloraprep       Needles:   Needle Type: Other     Needle Length: 9cm  Needle Gauge: 21    Additional Needles:  Procedures: ultrasound guided (picture in chart) Femoral nerve block Narrative:  Start time: 11/23/2012 12:06 PM End time: 11/23/2012 12:13 PM Injection made incrementally with aspirations every 5 mL.  Performed by: Personally  Anesthesiologist: C. Frederick MD  Additional Notes: Ultrasound guidance used to: id relevant anatomy, confirm needle position, local anesthetic spread, avoidance of vascular puncture. Picture saved. No complications. Block performed personally by Janetta Hora. Gelene Mink, MD    Femoral nerve block Procedure Name: Intubation Date/Time: 11/23/2012 12:49 PM Performed by: Gwenyth Allegra Pre-anesthesia Checklist: Patient identified, Timeout performed, Emergency Drugs available and Patient being monitored Patient Re-evaluated:Patient Re-evaluated prior to inductionOxygen Delivery Method: Circle system utilized Preoxygenation: Pre-oxygenation with 100% oxygen Intubation Type: IV induction Ventilation: Mask ventilation without difficulty Laryngoscope Size: Mac and 3 Grade View: Grade I Tube type: Oral Tube size: 7.0 mm Number of attempts: 1 Airway Equipment and Method: Patient positioned with wedge pillow Placement Confirmation: ETT inserted through vocal cords under direct vision and positive ETCO2 Secured at: 21 cm Tube secured with: Tape Dental Injury: Teeth and Oropharynx as per pre-operative assessment

## 2012-11-23 NOTE — Transfer of Care (Signed)
Immediate Anesthesia Transfer of Care Note  Patient: April Robertson  Procedure(s) Performed: Procedure(s) with comments: COMPUTER ASSISTED TOTAL KNEE ARTHROPLASTY (Right) - GENERAL WITH PRE OP FEMORALL NERVE BLOCK  Patient Location: PACU  Anesthesia Type:GA combined with regional for post-op pain  Level of Consciousness: awake, alert  and oriented  Airway & Oxygen Therapy: Patient Spontanous Breathing and Patient connected to nasal cannula oxygen  Post-op Assessment: Report given to PACU RN and Post -op Vital signs reviewed and stable  Post vital signs: Reviewed and stable  Complications: No apparent anesthesia complications

## 2012-11-23 NOTE — Preoperative (Signed)
Beta Blockers   Reason not to administer Beta Blockers:Not Applicable 

## 2012-11-23 NOTE — Brief Op Note (Signed)
11/23/2012  2:40 PM  PATIENT:  April Robertson  57 y.o. female  PRE-OPERATIVE DIAGNOSIS:  DEGENERATIVE JOINT DISEASE  POST-OPERATIVE DIAGNOSIS:  degenerative joint disease right knee  PROCEDURE:  Procedure(s) with comments: COMPUTER ASSISTED TOTAL KNEE ARTHROPLASTY (Right) - GENERAL WITH PRE OP FEMORALL NERVE BLOCK  SURGEON:  Surgeon(s) and Role:    * Harvie Junior, MD - Primary  PHYSICIAN ASSISTANT:   ASSISTANTS: bethune   ANESTHESIA:   general  EBL:  Total I/O In: 1000 [I.V.:1000] Out: 75 [Urine:75]  BLOOD ADMINISTERED:none  DRAINS: (1) Hemovact drain(s) in the r knee with  Suction Open   LOCAL MEDICATIONS USED:  MARCAINE     SPECIMEN:  No Specimen  DISPOSITION OF SPECIMEN:  N/A  COUNTS:  YES  TOURNIQUET:   Total Tourniquet Time Documented: Thigh (Right) - 85 minutes Total: Thigh (Right) - 85 minutes   DICTATION: .Other Dictation: Dictation Number [id number]  PLAN OF CARE: Admit to inpatient   PATIENT DISPOSITION:  PACU - hemodynamically stable.   Delay start of Pharmacological VTE agent (>24hrs) due to surgical blood loss or risk of bleeding: no

## 2012-11-23 NOTE — Progress Notes (Signed)
Gus Puma PA visited with patient

## 2012-11-23 NOTE — Progress Notes (Signed)
Orthopedic Tech Progress Note Patient Details:  April Robertson 1955-10-27 [id number]  CPM Right Knee CPM Right Knee: On Right Knee Flexion (Degrees): 60 Right Knee Extension (Degrees): 0 Additional Comments: trapeze bar patient helper   Nikki Dom 11/23/2012, 4:04 PM

## 2012-11-23 NOTE — Anesthesia Preprocedure Evaluation (Signed)
Anesthesia Evaluation  Patient identified by MRN, date of birth, ID band Patient awake    Reviewed: Allergy & Precautions, H&P , NPO status , Patient's Chart, lab work & pertinent test results, reviewed documented beta blocker date and time   Airway Mallampati: II TM Distance: >3 FB Neck ROM: full    Dental   Pulmonary neg pulmonary ROS,  breath sounds clear to auscultation        Cardiovascular hypertension, On Medications and On Home Beta Blockers + Valvular Problems/Murmurs MR Rhythm:regular     Neuro/Psych negative neurological ROS  negative psych ROS   GI/Hepatic negative GI ROS, Neg liver ROS,   Endo/Other  negative endocrine ROS  Renal/GU negative Renal ROS  negative genitourinary   Musculoskeletal   Abdominal   Peds  Hematology negative hematology ROS (+)   Anesthesia Other Findings See surgeon's H&P   Reproductive/Obstetrics negative OB ROS                           Anesthesia Physical Anesthesia Plan  ASA: III  Anesthesia Plan: General   Post-op Pain Management:    Induction: Intravenous  Airway Management Planned: Oral ETT  Additional Equipment:   Intra-op Plan:   Post-operative Plan: Extubation in OR  Informed Consent: I have reviewed the patients History and Physical, chart, labs and discussed the procedure including the risks, benefits and alternatives for the proposed anesthesia with the patient or authorized representative who has indicated his/her understanding and acceptance.   Dental Advisory Given  Plan Discussed with: CRNA and Surgeon  Anesthesia Plan Comments:         Anesthesia Quick Evaluation

## 2012-11-24 LAB — URINE CULTURE: Colony Count: NO GROWTH

## 2012-11-24 LAB — BASIC METABOLIC PANEL
BUN: 18 mg/dL (ref 6–23)
CO2: 27 mEq/L (ref 19–32)
Chloride: 101 mEq/L (ref 96–112)
Creatinine, Ser: 0.69 mg/dL (ref 0.50–1.10)
Glucose, Bld: 115 mg/dL — ABNORMAL HIGH (ref 70–99)

## 2012-11-24 LAB — CBC
HCT: 28.6 % — ABNORMAL LOW (ref 36.0–46.0)
Hemoglobin: 9.8 g/dL — ABNORMAL LOW (ref 12.0–15.0)
MCV: 99.3 fL (ref 78.0–100.0)
RBC: 2.88 MIL/uL — ABNORMAL LOW (ref 3.87–5.11)
RDW: 13.2 % (ref 11.5–15.5)
WBC: 5.3 10*3/uL (ref 4.0–10.5)

## 2012-11-24 NOTE — Progress Notes (Signed)
Subjective: 1 Day Post-Op Procedure(s) (LRB): COMPUTER ASSISTED TOTAL KNEE ARTHROPLASTY (Right) Patient reports pain as 4 on 0-10 scale.   Foley out this am.  Up doing early ambulation.  Objective: Vital signs in last 24 hours: Temp:  [97.7 F (36.5 C)-99.6 F (37.6 C)] 98.8 F (37.1 C) (04/08 0648) Pulse Rate:  [73-100] 73 (04/08 0648) Resp:  [15-22] 16 (04/08 0648) BP: (103-145)/(63-92) 103/63 mmHg (04/08 0648) SpO2:  [96 %-100 %] 97 % (04/08 0648)  Intake/Output from previous day: 04/07 0701 - 04/08 0700 In: 1600 [I.V.:1600] Out: 1525 [Urine:925; Drains:600] Intake/Output this shift:     Recent Labs  11/24/12 0430  HGB 9.8*    Recent Labs  11/24/12 0430  WBC 5.3  RBC 2.88*  HCT 28.6*  PLT 152    Recent Labs  11/24/12 0430  NA 137  K 4.1  CL 101  CO2 27  BUN 18  CREATININE 0.69  GLUCOSE 115*  CALCIUM 8.3*   No results found for this basename: LABPT, INR,  in the last 72 hours Right knee exam: Neurovascular intact Sensation intact distally Intact pulses distally Dorsiflexion/Plantar flexion intact Incision: dressing C/D/I Compartment soft  Assessment/Plan: 1 Day Post-Op Procedure(s) (LRB): COMPUTER ASSISTED TOTAL KNEE ARTHROPLASTY (Right) Plan: Up with therapy Plan for discharge tomorrow Hemovac drain pulled. ASA 325mg  BID for VTE prophylaxis  Lillyth Spong G 11/24/2012, 9:46 AM

## 2012-11-24 NOTE — Progress Notes (Signed)
UR COMPLETED  

## 2012-11-24 NOTE — Care Management Note (Signed)
CARE MANAGEMENT NOTE 11/24/2012  Patient:  April Robertson, April Robertson   Account Number:  [id number]  Date Initiated:  11/24/2012  Documentation initiated by:  Vance Peper  Subjective/Objective Assessment:   57 yr old female s/p right total knee arthroplasty.     Action/Plan:   CM spoke with patient concerning home health and DME needs at discharge.  Choice offered. Patient has rolling walker, waiting for 3in1 and CPM to be delivered.   Anticipated DC Date:  11/25/2012   Anticipated DC Plan:  HOME W HOME HEALTH SERVICES      DC Planning Services  CM consult      [facility] Choice  HOME HEALTH   Choice offered to / List presented to:  C-1 Patient        HH arranged  HH-2 PT      [facility] agency  [facility]   Status of service:  In process, will continue to follow Medicare Important Message given?   (If response is "NO", the following Medicare IM given date fields will be blank) Date Medicare IM given:   Date Additional Medicare IM given:    Discharge Disposition:  HOME W HOME HEALTH SERVICES  Per UR Regulation:    If discussed at Long Length of Stay Meetings, dates discussed:    Comments:

## 2012-11-24 NOTE — Evaluation (Signed)
Physical Therapy Evaluation Patient Details Name: April Robertson MRN: [id number] DOB: 08/25/1955 Today's Date: 11/24/2012 Time: 4098-1191 PT Time Calculation (min): 42 min  PT Assessment / Plan / Recommendation Clinical Impression  pt presents with R TKA.  pt moving well and anticipate good progress.      PT Assessment  Patient needs continued PT services    Follow Up Recommendations  Home health PT;Supervision - Intermittent    Does the patient have the potential to tolerate intense rehabilitation      Barriers to Discharge None      Equipment Recommendations  Rolling walker with 5" wheels    Recommendations for Other Services     Frequency 7X/week    Precautions / Restrictions Precautions Precautions: Fall Required Braces or Orthoses: Knee Immobilizer - Right Knee Immobilizer - Right: On when out of bed or walking Restrictions Weight Bearing Restrictions: Yes RLE Weight Bearing: Weight bearing as tolerated   Pertinent Vitals/Pain Did not rate, but indicates some pain with mobility.  Premedicated.        Mobility  Bed Mobility Bed Mobility: Supine to Sit;Sitting - Scoot to Edge of Bed Supine to Sit: 5: Supervision;With rails;HOB elevated Sitting - Scoot to Edge of Bed: 5: Supervision Details for Bed Mobility Assistance: cues for sequencing and encouragement.  pt able to perform without physical A.   Transfers Transfers: Sit to Stand;Stand to Sit Sit to Stand: 4: Min guard;With upper extremity assist;From bed Stand to Sit: 4: Min guard;With upper extremity assist;To chair/3-in-1;With armrests Details for Transfer Assistance: cues for UE use, positioning of LEs, controlling descent to chair.   Ambulation/Gait Ambulation/Gait Assistance: 4: Min guard Ambulation Distance (Feet): 140 Feet Assistive device: Rolling walker Gait Pattern: Step-to pattern;Decreased step length - left;Decreased stance time - right Stairs: No Wheelchair Mobility Wheelchair Mobility: No     Exercises Total Joint Exercises Ankle Circles/Pumps: AROM;Both;10 reps Quad Sets: AROM;Both;10 reps Long Arc Quad: AAROM;Right;10 reps Knee Flexion: AAROM;Right;10 reps   PT Diagnosis: Abnormality of gait;Acute pain  PT Problem List: Decreased strength;Decreased range of motion;Decreased activity tolerance;Decreased balance;Decreased mobility;Decreased knowledge of use of DME;Pain PT Treatment Interventions: DME instruction;Gait training;Stair training;Functional mobility training;Therapeutic activities;Therapeutic exercise;Balance training;Patient/family education   PT Goals Acute Rehab PT Goals PT Goal Formulation: With patient Time For Goal Achievement: 12/01/12 Potential to Achieve Goals: Good Pt will go Sit to Supine/Side: with modified independence PT Goal: Sit to Supine/Side - Progress: Goal set today Pt will go Sit to Stand: with modified independence PT Goal: Sit to Stand - Progress: Goal set today Pt will go Stand to Sit: with modified independence PT Goal: Stand to Sit - Progress: Goal set today Pt will Ambulate: >150 feet;with modified independence;with rolling walker PT Goal: Ambulate - Progress: Goal set today Pt will Go Up / Down Stairs: 3-5 stairs;with min assist;with least restrictive assistive device PT Goal: Up/Down Stairs - Progress: Goal set today Pt will Perform Home Exercise Program: with supervision, verbal cues required/provided PT Goal: Perform Home Exercise Program - Progress: Goal set today  Visit Information  Last PT Received On: 11/24/12 Assistance Needed: +1    Subjective Data  Subjective: I just don't know what to expect.   Patient Stated Goal: Travel.     Prior Functioning  Home Living Lives With: Spouse Available Help at Discharge: Family;Available 24 hours/day Type of Home: House Home Access: Stairs to enter [employer] of Steps: 2 Home Layout: One level Bathroom Toilet: Standard Home Adaptive Equipment: None Prior  Function Level of Independence:  Independent Able to Take Stairs?: Yes Driving: Yes Communication Communication: No difficulties    Cognition  Cognition Overall Cognitive Status: Appears within functional limits for tasks assessed/performed Arousal/Alertness: Awake/alert Orientation Level: Appears intact for tasks assessed Behavior During Session: [facility] for tasks performed    Extremity/Trunk Assessment Right Lower Extremity Assessment RLE ROM/Strength/Tone: Deficits RLE ROM/Strength/Tone Deficits: AAROM ~10-75 RLE Sensation: WFL - Light Touch Left Lower Extremity Assessment LLE ROM/Strength/Tone: WFL for tasks assessed LLE Sensation: WFL - Light Touch Trunk Assessment Trunk Assessment: Normal   Balance Balance Balance Assessed: No  End of Session PT - End of Session Equipment Utilized During Treatment: Gait belt Activity Tolerance: Patient tolerated treatment well Patient left: in chair;with call bell/phone within reach;with family/visitor present Nurse Communication: Mobility status CPM Right Knee CPM Right Knee: Off Additional Comments: trapeze bar patient helper  GP     Sunny Schlein, [place] [phone] 11/24/2012, 11:00 AM

## 2012-11-24 NOTE — Op Note (Signed)
NAMETAMRAH, VICTORINO NO.:  [id number]  MEDICAL RECORD NO.:  [id number]  LOCATION:  [id number]                        FACILITY:  [facility]  PHYSICIAN:  Harvie Junior, M.D.   DATE OF BIRTH:  11/10/1955  DATE OF PROCEDURE:  11/23/2012 DATE OF DISCHARGE:                              OPERATIVE REPORT   PREOPERATIVE DIAGNOSIS:  End-stage degenerative joint disease, right knee.  POSTOPERATIVE DIAGNOSIS:  End-stage degenerative joint disease, right knee.  PROCEDURE:  Right total knee replacement with Sigma system, size 2 femur, size 2 tibia, 10 mm bridging bearing, and a 32 mm all polyethylene patella.  SURGEON:  Harvie Junior, M.D.  ASSISTANT:  Marshia Ly, PA  ANESTHESIA:  General.  BRIEF HISTORY:  Mr. Busic is a 57 year old female with a history of having significant complaints of right knee pain.  She had been treated conservatively for prolonged period of time.  She has had previous arthroscopy.  She had an injection therapy.  She has had viscous supplementation.  She has had activity modification.  After failure of all conservative care, she was also taken to the operating room for right total knee replacement.  Preoperative x-ray is showing severe bone- on-bone changes and failed about 5 years  of conservative care.  Because of her young age, the patient was considered to be a candidate for computer-assisted total knee replacement.  She was brought to the operating room for this procedure.  PROCEDURE IN DETAIL:  The patient was taken to the operating room. After adequate anesthesia was obtained with general anesthetic, the patient was placed on the operating table. The right leg was then prepped and draped in usual sterile fashion.  Following this, the leg was exsanguinated.  Blood pressure tourniquet was inflated to 350 mmHg. Following this, a midline incision was made.  Subcutaneous tissues down the level of the extensor mechanism and a medial  parapatellar arthrotomy was undertaken.  Once this was done, the medial and lateral meniscus were removed.  Retropatellar fat pad, synovium in the anterior aspect of the femur, and the anterior and posterior cruciate stumps was done. Attention was turned to the tibia were 2 pins were placed.  Through stab puncture wounds, 2 pins were placed in the femur and once this was completed, attention was turned towards the registration process and that is about 30 minutes of surgical procedure.  Once the overall registration process was undertaken, the tibia was then cut perpendicular to its long axis.  The femur was cut perpendicular to the anatomic axis and spacer block was put in place and a 10 spacer block fit.  Attention turned to the femur, which was sized initially to a 2.5, although, we felt the tibia would be at 2.  Once we sized this to a 2.5, we did anterior and posterior cuts, really could not get the flexion paddle in, so at that point, downsized her to a 2 and got about another 2 mm out of the back, which gave better access to the flexion paddle. Once that was completed, attention was then turned towards chamfers box. Attention was then turned to the tibia, which was sized to a 2, it is drilled and  keeled and once that was completed, the trials were put in place a trial 2 tibia, trial 2 femur, 10 mm bridging bearing, and once that is completed, attention was turned to the patella, which was sized to a 32 is drilled and a trial patella was put in place.  The knee was put through a range of motion at this point.  Computer is checked.  She has a 0 neutral long alignment and a perfect gap balance.  On this, we then removed all trial components and copiously and thoroughly lavaged the knee, suctioned it dry.  The final components were cemented into place.  A size 2 tibia, size 2 femur, 10 mm bridging bearing trial was placed and 32 all poly patella was held with a clamp.  Once the  bone cement was allowed to harden completely, all excess bone cement was removed, and a tourniquet was let down.  Once this was done, attention was turned towards controlling all bleeders with electrocautery.  The knee is copiously and thoroughly lavaged, suctioned dried, and once this was completed, we trialed a 12.5 just to check, it is too tight in extension and too tight in flexion as well.  At that point, back to the 10 trial.  The 10 final poly was placed.  A medium Hemovac drain was placed.  The medial parapatellar arthrotomy was closed with 1 Vicryl running, skin with 0 and 2-0 Vicryl, and 3-0 Monocryl subcuticular. Benzoin and Steri-Strips were applied.  Sterile compressive dressing was applied.  The patient was taken to the recovery in satisfactory condition.  Estimated blood loss for the procedure is __________.     Harvie Junior, M.D.     Ranae Plumber  D:  11/23/2012  T:  11/24/2012  Job:  [id number]

## 2012-11-24 NOTE — Progress Notes (Signed)
Physical Therapy Treatment Patient Details Name: Frayda Egley MRN: [id number] DOB: 01-19-1956 Today's Date: 11/24/2012 Time: 4696-2952 PT Time Calculation (min): 17 min  PT Assessment / Plan / Recommendation Comments on Treatment Session  pt presents with R TKA.  pt making great progress.  pt indicates performing ther ex on her own multiple times today and feels sore now.  Anticipate pt will be ready for D/C tomorrow from PT stand point.      Follow Up Recommendations  Home health PT;Supervision - Intermittent     Does the patient have the potential to tolerate intense rehabilitation     Barriers to Discharge        Equipment Recommendations  Rolling walker with 5" wheels    Recommendations for Other Services    Frequency 7X/week   Plan Discharge plan remains appropriate;Frequency remains appropriate    Precautions / Restrictions Precautions Precautions: Fall Required Braces or Orthoses: Knee Immobilizer - Right Knee Immobilizer - Right: On when out of bed or walking Restrictions Weight Bearing Restrictions: Yes RLE Weight Bearing: Weight bearing as tolerated   Pertinent Vitals/Pain 6/10    Mobility  Bed Mobility Bed Mobility: Sit to Supine Sit to Supine: 5: Supervision Details for Bed Mobility Assistance: pt able to use L LE to A R LE into bed.   Transfers Transfers: Sit to Stand;Stand to Sit Sit to Stand: 5: Supervision;With upper extremity assist;From toilet Stand to Sit: 5: Supervision;With upper extremity assist;To bed Details for Transfer Assistance: cues to get closer to bed prior to letting go of RW.   Ambulation/Gait Ambulation/Gait Assistance: 5: Supervision Ambulation Distance (Feet): 150 Feet Assistive device: Rolling walker Ambulation/Gait Assistance Details: cues for more fluid gait pattern, equalizing step length, decreasing WBing on UEs.   Gait Pattern: Step-to pattern;Decreased step length - left;Decreased stance time - right Stairs: No Wheelchair  Mobility Wheelchair Mobility: No    Exercises     PT Diagnosis:    PT Problem List:   PT Treatment Interventions:     PT Goals Acute Rehab PT Goals Time For Goal Achievement: 12/01/12 Potential to Achieve Goals: Good PT Goal: Sit to Supine/Side - Progress: Progressing toward goal PT Goal: Sit to Stand - Progress: Progressing toward goal PT Goal: Stand to Sit - Progress: Progressing toward goal PT Goal: Ambulate - Progress: Progressing toward goal  Visit Information  Last PT Received On: 11/24/12 Assistance Needed: +1    Subjective Data  Subjective: This is getting better.     Cognition  Cognition Overall Cognitive Status: Appears within functional limits for tasks assessed/performed Arousal/Alertness: Awake/alert Orientation Level: Appears intact for tasks assessed Behavior During Session: [facility] for tasks performed    Balance  Balance Balance Assessed: No  End of Session PT - End of Session Equipment Utilized During Treatment: Gait belt Activity Tolerance: Patient tolerated treatment well Patient left: in bed;with call bell/phone within reach Nurse Communication: Mobility status CPM Right Knee CPM Right Knee: On Right Knee Flexion (Degrees): 65 Right Knee Extension (Degrees): 0   GP     RitenourAlison Murray, [place] [phone] 11/24/2012, 3:10 PM

## 2012-11-25 ENCOUNTER — Encounter (HOSPITAL_COMMUNITY): Payer: Self-pay | Admitting: Orthopedic Surgery

## 2012-11-25 LAB — CBC
HCT: 28.6 % — ABNORMAL LOW (ref 36.0–46.0)
Hemoglobin: 9.9 g/dL — ABNORMAL LOW (ref 12.0–15.0)
MCHC: 34.6 g/dL (ref 30.0–36.0)
MCV: 100 fL (ref 78.0–100.0)
RDW: 13.2 % (ref 11.5–15.5)

## 2012-11-25 MED ORDER — OXYCODONE-ACETAMINOPHEN 5-325 MG PO TABS: 1.0000 | ORAL_TABLET | ORAL | Status: DC | PRN

## 2012-11-25 MED ORDER — METHOCARBAMOL 500 MG PO TABS: 500.0000 mg | ORAL_TABLET | Freq: Four times a day (QID) | ORAL | Status: DC | PRN

## 2012-11-25 MED ORDER — OXYCODONE HCL ER 10 MG PO T12A: 10.0000 mg | EXTENDED_RELEASE_TABLET | Freq: Two times a day (BID) | ORAL | Status: DC

## 2012-11-25 MED ORDER — ASPIRIN 325 MG PO TBEC: 325.0000 mg | DELAYED_RELEASE_TABLET | Freq: Two times a day (BID) | ORAL | Status: DC

## 2012-11-25 NOTE — Progress Notes (Signed)
Pt discharged to home accompanied by husband. Discharge instructions and rx given and explained and pt stated understanding. Pts IV was removed prior to leaving. Pt left unit in a stable condition via wheelchair.

## 2012-11-25 NOTE — Discharge Summary (Signed)
Patient ID: April Robertson MRN: [id number] DOB/AGE: 20-Jul-1956 64 y.o.  Admit date: 11/23/2012 Discharge date: 11/25/2012  Admission Diagnoses:  Principal Problem:   Osteoarthritis of right knee   Discharge Diagnoses:  Same  Past Medical History  Diagnosis Date  . Tachycardia   . Anxiety   . Mitral valve regurgitation     Surgeries: Procedure(s):Right COMPUTER ASSISTED TOTAL KNEE ARTHROPLASTY on 11/23/2012    Discharged Condition: Improved  Hospital Course: April Robertson is an 57 y.o. female who was admitted 11/23/2012 for operative treatment ofOsteoarthritis of right knee. Patient has severe unremitting pain that affects sleep, daily activities, and work/hobbies. After pre-op clearance the patient was taken to the operating room on 11/23/2012 and underwent  Procedure(s):Right COMPUTER ASSISTED TOTAL KNEE ARTHROPLASTY.    Patient was given perioperative antibiotics: Anti-infectives   Start     Dose/Rate Route Frequency Ordered Stop   11/23/12 1700  ceFAZolin (ANCEF) IVPB 2 g/50 mL premix     2 g 100 mL/hr over 30 Minutes Intravenous Every 6 hours 11/23/12 1535 11/24/12 0002   11/23/12 1324  cefUROXime (ZINACEF) injection  Status:  Discontinued       As needed 11/23/12 1324 11/23/12 1507   11/23/12 1112  ceFAZolin (ANCEF) IVPB 2 g/50 mL premix  Status:  Discontinued     2 g 100 mL/hr over 30 Minutes Intravenous On call to O.R. 11/23/12 1112 11/23/12 1113   11/23/12 0600  ceFAZolin (ANCEF) IVPB 2 g/50 mL premix     2 g 100 mL/hr over 30 Minutes Intravenous On call to O.R. 11/22/12 1333 11/23/12 1230       Patient was given sequential compression devices, early ambulation, and chemoprophylaxis to prevent DVT. Follow-up urine culture showed no growth.  Patient benefited maximally from hospital stay and there were no complications.    Recent vital signs: Patient Vitals for the past 24 hrs:  BP Temp Pulse Resp SpO2  11/25/12 0538 102/65 mmHg 99.6 F (37.6 C) 82 18 99 %  11/24/12  2103 111/65 mmHg 99.1 F (37.3 C) 92 18 99 %  11/24/12 1423 91/57 mmHg 98.4 F (36.9 C) 72 16 98 %     Recent laboratory studies:  Recent Labs  11/24/12 0430 11/25/12 0455  WBC 5.3 6.1  HGB 9.8* 9.9*  HCT 28.6* 28.6*  PLT 152 162  NA 137  --   K 4.1  --   CL 101  --   CO2 27  --   BUN 18  --   CREATININE 0.69  --   GLUCOSE 115*  --   CALCIUM 8.3*  --      Discharge Medications:     Medication List    STOP taking these medications       ibuprofen 200 MG tablet  Commonly known as:  ADVIL,MOTRIN     traMADol 50 MG tablet  Commonly known as:  ULTRAM      TAKE these medications       ALPRAZolam 0.5 MG tablet  Commonly known as:  XANAX  Take 0.5 mg by mouth 3 (three) times daily as needed for anxiety.     aspirin 325 MG EC tablet  Take 1 tablet (325 mg total) by mouth 2 (two) times daily after a meal.     diphenhydrAMINE 25 MG tablet  Commonly known as:  BENADRYL  Take 12.5 mg by mouth at bedtime as needed for sleep.     methocarbamol 500 MG tablet  Commonly known as:  ROBAXIN  Take 1 tablet (500 mg total) by mouth every 6 (six) hours as needed (spasm).     metoprolol 50 MG tablet  Commonly known as:  LOPRESSOR  Take 75 mg by mouth 2 (two) times daily.     OxyCODONE 10 mg T12a  Commonly known as:  OXYCONTIN  Take 1 tablet (10 mg total) by mouth every 12 (twelve) hours.     oxyCODONE-acetaminophen 5-325 MG per tablet  Commonly known as:  PERCOCET/ROXICET  Take 1-2 tablets by mouth every 4 (four) hours as needed for pain.        Diagnostic Studies: Dg Chest 2 View  11/16/2012  *RADIOLOGY REPORT*  Clinical Data: Preop knee arthroplasty  CHEST - 2 VIEW  Comparison: None.  Findings: Surgical clips right upper lobe.  Surgical wire device in the region of the mitral valve.  Heart size is normal.  Negative for heart failure.  Lungs are clear without infiltrate or effusion.  Negative for mass lesion.  IMPRESSION: Postop changes.  No acute abnormality.    Original Report Authenticated By: Janeece Riggers, M.D.     Disposition: Final discharge disposition not confirmed      Discharge Orders   Future Orders Complete By Expires     CPM  As directed     Comments:      Continuous passive motion machine (CPM):      Use the CPM from 0 degrees to 60 degrees for 8 hours per day.      You may increase by 10 degrees per day.  You may break it up into 2 or 3 sessions per day.      Use CPM for 1-2 weeks or until you are told to stop.    Constipation Prevention  As directed     Comments:      Drink plenty of fluids.  Prune juice may be helpful.  You may use a stool softener, such as Colace (over the counter) 100 mg twice a day.  Use MiraLax (over the counter) for constipation as needed.    Diet general  As directed     Do not put a pillow under the knee. Place it under the heel.  As directed     Increase activity slowly as tolerated  As directed     Weight bearing as tolerated  As directed        Follow-up Information   Follow up with GRAVES,JOHN L, MD. Schedule an appointment as soon as possible for a visit in 2 weeks.   Contact information:   [address] [place] [address] [phone]        Signed: Matthew Folks 11/25/2012, 11:24 AM

## 2012-11-25 NOTE — Progress Notes (Signed)
Physical Therapy Treatment Patient Details Name: April Robertson MRN: [id number] DOB: 03-Jul-1956 Today's Date: 11/25/2012 Time: 4098-1191 PT Time Calculation (min): 33 min  PT Assessment / Plan / Recommendation Comments on Treatment Session  Patient prsents with R TKA.  Increased ambulation.  Increased exercises.  Cues to put weight through LE.  Able to tolerate stairs training with husband present.  Patient eager to stand w/o supervision.    Follow Up Recommendations  Home health PT;Supervision - Intermittent     Does the patient have the potential to tolerate intense rehabilitation     Barriers to Discharge        Equipment Recommendations  Rolling walker with 5" wheels    Recommendations for Other Services    Frequency 7X/week   Plan Discharge plan remains appropriate;Frequency remains appropriate    Precautions / Restrictions Precautions Precautions: Fall Required Braces or Orthoses: Knee Immobilizer - Right Knee Immobilizer - Right: On when out of bed or walking Restrictions Weight Bearing Restrictions: Yes RLE Weight Bearing: Weight bearing as tolerated   Pertinent Vitals/Pain 5/10 RLE    Mobility  Transfers Transfers: Sit to Stand;Stand to Sit Sit to Stand: 5: Supervision;From chair/3-in-1;With upper extremity assist Stand to Sit: 5: Supervision;With upper extremity assist;To chair/3-in-1 Details for Transfer Assistance: Cues to use UE for chair.   Ambulation/Gait Ambulation/Gait Assistance: 5: Supervision Ambulation Distance (Feet): 200 Feet Assistive device: Rolling walker Ambulation/Gait Assistance Details: Cues to put weight through LE.   Gait Pattern: Step-to pattern;Decreased step length - left;Decreased stance time - right Stairs: Yes Stairs Assistance: 4: Min guard Stair Management Technique: One rail Left;Forwards;With cane Number of Stairs: 15 Wheelchair Mobility Wheelchair Mobility: No    Exercises Total Joint Exercises Quad Sets: AROM;Right;15  reps Heel Slides: AROM;Right;15 reps Straight Leg Raises: AAROM;Right;10 reps Long Arc Quad: AROM;Right;15 reps   PT Diagnosis:    PT Problem List:   PT Treatment Interventions:     PT Goals Acute Rehab PT Goals PT Goal: Stand to Sit - Progress: Progressing toward goal PT Goal: Ambulate - Progress: Progressing toward goal PT Goal: Up/Down Stairs - Progress: Progressing toward goal PT Goal: Perform Home Exercise Program - Progress: Progressing toward goal  Visit Information  Last PT Received On: 11/25/12 Assistance Needed: +1    Subjective Data      Cognition  Cognition Overall Cognitive Status: Appears within functional limits for tasks assessed/performed Arousal/Alertness: Awake/alert Orientation Level: Appears intact for tasks assessed Behavior During Session: [facility] for tasks performed    Balance     End of Session PT - End of Session Equipment Utilized During Treatment: Gait belt Activity Tolerance: Patient tolerated treatment well Patient left: in chair;with call bell/phone within reach CPM Right Knee CPM Right Knee: Off Additional Comments: trapeze bar patient helper   GP     Maximino Greenland JEAN  SPTA 11/25/2012, 9:43 AM

## 2012-11-25 NOTE — Progress Notes (Signed)
Subjective: 2 Days Post-Op Procedure(s) (LRB): COMPUTER ASSISTED TOTAL KNEE ARTHROPLASTY (Right) Patient reports pain as 4 on 0-10 scale.   Taking po/voiding ok. Good progress with PT.  Objective: Vital signs in last 24 hours: Temp:  [98.4 F (36.9 C)-99.6 F (37.6 C)] 99.6 F (37.6 C) (04/09 0538) Pulse Rate:  [72-92] 82 (04/09 0538) Resp:  [16-18] 18 (04/09 0538) BP: (91-111)/(57-65) 102/65 mmHg (04/09 0538) SpO2:  [98 %-99 %] 99 % (04/09 0538)  Intake/Output from previous day: 04/08 0701 - 04/09 0700 In: 720 [P.O.:720] Out: 250 [Urine:250] Intake/Output this shift:     Recent Labs  11/24/12 0430 11/25/12 0455  HGB 9.8* 9.9*    Recent Labs  11/24/12 0430 11/25/12 0455  WBC 5.3 6.1  RBC 2.88* 2.86*  HCT 28.6* 28.6*  PLT 152 162    Recent Labs  11/24/12 0430  NA 137  K 4.1  CL 101  CO2 27  BUN 18  CREATININE 0.69  GLUCOSE 115*  CALCIUM 8.3*   Urine Culture   No growth. Right knee exam: Neurovascular intact Sensation intact distally Intact pulses distally Dorsiflexion/Plantar flexion intact Compartment soft Wound benign.  Assessment/Plan: 2 Days Post-Op Procedure(s) (LRB): COMPUTER ASSISTED TOTAL KNEE ARTHROPLASTY (Right) Plan: Discharge home with home health Dressing changed. Annalisia Ingber G 11/25/2012, 11:11 AM

## 2012-11-25 NOTE — Progress Notes (Signed)
Seen and agreed 11/25/2012 Syvilla Martin Elizabeth PTA [phone] pager [phone] office    

## 2013-04-28 ENCOUNTER — Other Ambulatory Visit: Payer: Self-pay

## 2013-04-28 DIAGNOSIS — Z1231 Encounter for screening mammogram for malignant neoplasm of breast: Secondary | ICD-10-CM

## 2013-05-29 ENCOUNTER — Encounter: Payer: Self-pay | Admitting: Interventional Cardiology

## 2013-05-29 ENCOUNTER — Encounter: Payer: Self-pay | Admitting: *Deleted

## 2013-06-01 ENCOUNTER — Other Ambulatory Visit: Payer: Self-pay

## 2013-06-01 MED ORDER — METOPROLOL TARTRATE 50 MG PO TABS: 75.0000 mg | ORAL_TABLET | Freq: Two times a day (BID) | ORAL | Status: DC

## 2013-06-02 ENCOUNTER — Ambulatory Visit: Payer: PRIVATE HEALTH INSURANCE | Admitting: Interventional Cardiology

## 2013-06-09 ENCOUNTER — Ambulatory Visit
Admission: RE | Admit: 2013-06-09 | Discharge: 2013-06-09 | Disposition: A | Discharge status: Home / Self Care | Payer: Managed Care, Other (non HMO) | Source: Ambulatory Visit

## 2013-06-09 DIAGNOSIS — Z1231 Encounter for screening mammogram for malignant neoplasm of breast: Secondary | ICD-10-CM

## 2013-06-18 ENCOUNTER — Encounter (INDEPENDENT_AMBULATORY_CARE_PROVIDER_SITE_OTHER): Payer: Self-pay

## 2013-06-18 ENCOUNTER — Encounter: Payer: Self-pay | Admitting: Interventional Cardiology

## 2013-06-18 ENCOUNTER — Ambulatory Visit (INDEPENDENT_AMBULATORY_CARE_PROVIDER_SITE_OTHER): Payer: Managed Care, Other (non HMO) | Admitting: Interventional Cardiology

## 2013-06-18 VITALS — BP 104/86 | HR 78 | Ht 63.0 in | Wt 136.1 lb

## 2013-06-18 DIAGNOSIS — M1710 Unilateral primary osteoarthritis, unspecified knee: Secondary | ICD-10-CM

## 2013-06-18 DIAGNOSIS — IMO0002 Reserved for concepts with insufficient information to code with codable children: Secondary | ICD-10-CM

## 2013-06-18 DIAGNOSIS — I471 Supraventricular tachycardia: Secondary | ICD-10-CM | POA: Insufficient documentation

## 2013-06-18 DIAGNOSIS — Z9889 Other specified postprocedural states: Secondary | ICD-10-CM | POA: Insufficient documentation

## 2013-06-18 DIAGNOSIS — R002 Palpitations: Secondary | ICD-10-CM

## 2013-06-18 DIAGNOSIS — M1711 Unilateral primary osteoarthritis, right knee: Secondary | ICD-10-CM

## 2013-06-18 MED ORDER — METOPROLOL TARTRATE 50 MG PO TABS: 75.0000 mg | ORAL_TABLET | Freq: Two times a day (BID) | ORAL | Status: DC

## 2013-06-18 NOTE — Patient Instructions (Signed)
A refill of Metoprolol has been sent to your pharmacy  Your physician has recommended that you wear a holter monitor. Holter monitors are medical devices that record the heart's electrical activity. Doctors most often use these monitors to diagnose arrhythmias. Arrhythmias are problems with the speed or rhythm of the heartbeat. The monitor is a small, portable device. You can wear one while you do your normal daily activities. This is usually used to diagnose what is causing palpitations/syncope (passing out).  Your physician wants you to follow-up in: 1 year You will receive a reminder letter in the mail two months in advance. If you don't receive a letter, please call our office to schedule the follow-up appointment.

## 2013-06-18 NOTE — Progress Notes (Signed)
Patient ID: April Robertson, female   DOB: Jan 26, 1956, 57 y.o.   MRN: [id number]    [address] [place], [place]  [address] Phone: [phone] Fax:  [phone]  Date:  06/18/2013   ID:  April Robertson, DOB 1956/04/11, MRN [id number]  PCP:  Lillia Mountain, MD   ASSESSMENT:  1. Status post mitral valve repair with no clinical symptoms of heart failure or low cardiac output no murmur has been heard since the valve was repaired by Dr.Glower in 2008 2. Palpitations with previous history of PSVT. Rule out atrial fibrillation 3. History of paroxysmal supraventricular tachycardia 4. Recent total right knee replacement  PLAN:  1. 48 hour Holter monitor to assess palpitations, and rule out atrial fibrillation 2. Continue to observe and active lifestyle.   SUBJECTIVE: April Robertson is a 57 y.o. female who is doing well. She underwent right total knee replacement in March. EKG at that time was unremarkable. She had no cardiac concerns or problems during surgery or in rehabilitation. She denies shortness of breath. She has had frequent palpitations that last up to a minute. These are rapid heart actions. There is no lightheadedness, dizziness, chest pain, or syncope. There is no history of atrial fibrillation but a history of PSVT.   Wt Readings from Last 3 Encounters:  06/18/13 136 lb 1.9 oz (61.744 kg)  11/16/12 132 lb 15 oz (60.3 kg)     Past Medical History  Diagnosis Date  . Tachycardia   . Anxiety   . Mitral valve regurgitation   . Hypertension   . Mitral valve prolapse     history of mitral valve prolapse and mitral valve repair 2010. 2-D echo 2011 with LVEF greater than 70% and mild mitral regurgitation  . Insomnia   . Heart valve replaced   . Osteoarthritis   . SVT (supraventricular tachycardia)     Current Outpatient Prescriptions  Medication Sig Dispense Refill  . diphenhydrAMINE (BENADRYL) 25 MG tablet Take 12.5 mg by mouth at bedtime as needed for  sleep.      . metoprolol (LOPRESSOR) 50 MG tablet Take 1.5 tablets (75 mg total) by mouth 2 (two) times daily.  90 tablet  5  . traMADol (ULTRAM) 50 MG tablet Take 50 mg by mouth as needed.       No current facility-administered medications for this visit.    Allergies:   No Known Allergies  Social History:  The patient  reports that she has never smoked. She does not have any smokeless tobacco history on file. She reports that she drinks about 1.2 ounces of alcohol per week. She reports that she does not use illicit drugs.   ROS:  Please see the history of present illness.      All other systems reviewed and negative.   OBJECTIVE: VS:  BP 104/86  Pulse 78  Ht 5\' 3"  (1.6 m)  Wt 136 lb 1.9 oz (61.744 kg)  BMI 24.12 kg/m2  LMP 06/25/2012 Well nourished, well developed, in no acute distress, younger than stated age HEENT: normal Neck: JVD flat. Carotid bruit absent  Cardiac:  normal S1, S2; RRR; no murmur Lungs:  clear to auscultation bilaterally, no wheezing, rhonchi or rales Abd: soft, nontender, no hepatomegaly Ext: Edema absent. Pulses 2+ Skin: warm and dry Neuro:  CNs 2-12 intact, no focal abnormalities noted  EKG:  No significant abnormality noted in March prior to her knee surgery. Rhythm was normal sinus       Signed,  Darci Needle III, MD 06/18/2013 11:16 AM

## 2013-07-08 ENCOUNTER — Encounter: Payer: Self-pay | Admitting: Radiology

## 2013-07-08 ENCOUNTER — Encounter (INDEPENDENT_AMBULATORY_CARE_PROVIDER_SITE_OTHER): Payer: Managed Care, Other (non HMO)

## 2013-07-08 DIAGNOSIS — I471 Supraventricular tachycardia: Secondary | ICD-10-CM

## 2013-07-08 NOTE — Progress Notes (Signed)
Patient ID: April Robertson, female   DOB: 04/19/1956, 57 y.o.   MRN: [id number] E cardio 48hr holter monitor applied 

## 2013-07-08 NOTE — Progress Notes (Signed)
Patient ID: April Robertson, female   DOB: [birth year], 58 y.o.   MRN: [id number] E cardio 48hr holter monitor applied

## 2013-11-28 ENCOUNTER — Other Ambulatory Visit: Payer: Self-pay | Admitting: Interventional Cardiology

## 2013-12-01 ENCOUNTER — Other Ambulatory Visit: Payer: Self-pay | Admitting: Interventional Cardiology

## 2014-05-13 ENCOUNTER — Other Ambulatory Visit: Payer: Self-pay

## 2014-05-13 DIAGNOSIS — Z1231 Encounter for screening mammogram for malignant neoplasm of breast: Secondary | ICD-10-CM

## 2014-06-12 ENCOUNTER — Other Ambulatory Visit: Payer: Self-pay | Admitting: Interventional Cardiology

## 2014-06-13 ENCOUNTER — Ambulatory Visit
Admission: RE | Admit: 2014-06-13 | Discharge: 2014-06-13 | Disposition: A | Discharge status: Home / Self Care | Payer: BC Managed Care – PPO | Source: Ambulatory Visit

## 2014-06-13 DIAGNOSIS — Z1231 Encounter for screening mammogram for malignant neoplasm of breast: Secondary | ICD-10-CM

## 2014-07-17 ENCOUNTER — Other Ambulatory Visit: Payer: Self-pay | Admitting: Interventional Cardiology

## 2014-08-19 ENCOUNTER — Other Ambulatory Visit: Payer: Self-pay | Admitting: Interventional Cardiology

## 2014-08-20 ENCOUNTER — Other Ambulatory Visit: Payer: Self-pay | Admitting: Interventional Cardiology

## 2014-09-06 ENCOUNTER — Telehealth: Payer: Self-pay | Admitting: Interventional Cardiology

## 2014-09-06 MED ORDER — AMOXICILLIN 500 MG PO TABS: 2000.0000 mg | ORAL_TABLET | Freq: Two times a day (BID) | ORAL | Status: DC

## 2014-09-06 NOTE — Telephone Encounter (Signed)
Lmom.per Delphina CahillMichelle Lenze,PA .pt should have prophylaxis prior to her dental procedure. Amoxicillin 2000mg  to be taken 1 hr prior to dental procedure has been sent to pt pharmacy. Pt to call back with any questions or concerns

## 2014-09-06 NOTE — Telephone Encounter (Signed)
New problem   Pt is having dental procedure 1.20.16 and want to know if she need antibiotic and if not she need it in writing faxed to [phone].April Robertson.Attn Dr Albin Fellinghou

## 2014-10-05 ENCOUNTER — Other Ambulatory Visit: Payer: Self-pay | Admitting: Interventional Cardiology

## 2014-11-09 ENCOUNTER — Other Ambulatory Visit: Payer: Self-pay | Admitting: Interventional Cardiology

## 2014-11-14 ENCOUNTER — Ambulatory Visit (INDEPENDENT_AMBULATORY_CARE_PROVIDER_SITE_OTHER): Payer: BLUE CROSS/BLUE SHIELD | Admitting: Interventional Cardiology

## 2014-11-14 ENCOUNTER — Encounter: Payer: Self-pay | Admitting: Interventional Cardiology

## 2014-11-14 VITALS — BP 120/88 | HR 77 | Ht 63.0 in | Wt 133.0 lb

## 2014-11-14 DIAGNOSIS — Z9889 Other specified postprocedural states: Secondary | ICD-10-CM

## 2014-11-14 DIAGNOSIS — I471 Supraventricular tachycardia: Secondary | ICD-10-CM | POA: Diagnosis not present

## 2014-11-14 MED ORDER — METOPROLOL TARTRATE 50 MG PO TABS: 50.0000 mg | ORAL_TABLET | Freq: Two times a day (BID) | ORAL | Status: DC

## 2014-11-14 NOTE — Progress Notes (Signed)
Cardiology Office Note   Date:  11/14/2014   ID:  Avika Carbine, DOB [birth year], MRN [id number]  PCP:  Lillia Mountain, MD  Cardiologist:   Lesleigh Noe, MD   No chief complaint on file.     History of Present Illness: April Robertson is a 59 y.o. female who presents for follow-up of mitral valve repair 2010. She has no exertional intolerance other complaints. She denies any recent episodes of PSVT. She wonders if she can wean or discontinue metoprolol.    Past Medical History  Diagnosis Date  . Tachycardia   . Anxiety   . Mitral valve regurgitation   . Hypertension   . Mitral valve prolapse     history of mitral valve prolapse and mitral valve repair 2010. 2-D echo 2011 with LVEF greater than 70% and mild mitral regurgitation  . Insomnia   . Heart valve replaced   . Osteoarthritis   . SVT (supraventricular tachycardia)     Past Surgical History  Procedure Laterality Date  . Mitral valve repair  2010    minimally invasive  . Breast surgery      bilateral breast implants  . Knee arthroplasty Right 11/23/2012    Procedure: COMPUTER ASSISTED TOTAL KNEE ARTHROPLASTY;  Surgeon: Harvie Junior, MD;  Location: [facility] OR;  Service: Orthopedics;  Laterality: Right;  GENERAL WITH PRE OP FEMORALL NERVE BLOCK     Current Outpatient Prescriptions  Medication Sig Dispense Refill  . diphenhydrAMINE (BENADRYL) 25 MG tablet Take 12.5 mg by mouth at bedtime as needed for sleep.    . metoprolol (LOPRESSOR) 50 MG tablet TAKE 1 AND 1/2 TABLETS BY MOUTH TWICE DAILY 90 tablet 0   No current facility-administered medications for this visit.    Allergies:   Review of patient's allergies indicates no known allergies.    Social History:  The patient  reports that she has never smoked. She does not have any smokeless tobacco history on file. She reports that she drinks about 1.2 oz of alcohol per week. She reports that she does not use illicit drugs.   Family History:  The patient's  She was adopted. Family history is unknown by patient.    ROS:  Please see the history of present illness.   Otherwise, review of systems are positive for no complaints.   All other systems are reviewed and negative.    PHYSICAL EXAM: VS:  BP 120/88 mmHg  Pulse 77  Ht  (1.6 m)  Wt 133 lb (60.328 kg)  BMI 23.57 kg/m2  LMP 06/25/2012 , BMI Body mass index is 23.57 kg/(m^2). GEN: Well nourished, well developed, in no acute distress HEENT: normal Neck: no JVD, carotid bruits, or masses Cardiac: RRR; no murmurs, rubs, or gallops,no edema  Respiratory:  clear to auscultation bilaterally, normal work of breathing GI: soft, nontender, nondistended, + BS MS: no deformity or atrophy Skin: warm and dry, no rash Neuro:  Strength and sensation are intact Psych: euthymic mood, full affect   EKG:  EKG is not ordered today. Refused by patient.   Recent Labs: No results found for requested labs within last 365 days.    Lipid Panel No results found for: CHOL, TRIG, HDL, CHOLHDL, VLDL, LDLCALC, LDLDIRECT    Wt Readings from Last 3 Encounters:  11/14/14 133 lb (60.328 kg)  06/18/13 136 lb 1.9 oz (61.744 kg)  11/16/12 132 lb 15 oz (60.3 kg)      Other studies Reviewed: Additional studies/ records that were  reviewed today include: Prior records were reviewed.. Review of the above records demonstrates: No new data identified   ASSESSMENT AND PLAN: Paroxysmal supraventricular tachycardia  H/O mitral valve repair     Current medicines are reviewed at length with the patient today.  The patient has concerns regarding medicines.  The following changes have been made:  Wants to be off of metoprolol if possible. In absence of any recent PSVT episodes, we will decrease metoprolol to 50 mg twice a day. If she does well on this dose after one month we will decrease to 25 mg twice daily. If she does well at this dose we may discontinue the medication. She was informed that weaning and  discontinuing the medication may result in rebound tachycardia or PSVT episodes.  Labs/ tests ordered today include:  No orders of the defined types were placed in this encounter.     Disposition:   FU with Mendel RyderH. Smith in 1 year   Signed, Lesleigh NoeSMITH III,HENRY W, MD  11/14/2014 9:26 AM    [facility] [address], [place], [place]  [address] Phone: [phone]; Fax: [phone]

## 2014-11-14 NOTE — Patient Instructions (Addendum)
Your physician has recommended you make the following change in your medication:  1) DECREASE Metoprolol to 50 mg twice daily   Your physician wants you to follow-up in: 1 year with Dr. Katrinka BlazingSmith.  You will receive a reminder letter in the mail two months in advance. If you don't receive a letter, please call our office to schedule the follow-up appointment.

## 2014-11-29 ENCOUNTER — Other Ambulatory Visit: Payer: Self-pay | Admitting: Interventional Cardiology

## 2015-05-16 ENCOUNTER — Other Ambulatory Visit: Payer: Self-pay

## 2015-05-16 DIAGNOSIS — Z9882 Breast implant status: Secondary | ICD-10-CM

## 2015-05-16 DIAGNOSIS — Z1231 Encounter for screening mammogram for malignant neoplasm of breast: Secondary | ICD-10-CM

## 2015-06-15 ENCOUNTER — Ambulatory Visit
Admission: RE | Admit: 2015-06-15 | Discharge: 2015-06-15 | Disposition: A | Discharge status: Home / Self Care | Payer: BLUE CROSS/BLUE SHIELD | Source: Ambulatory Visit

## 2015-06-15 DIAGNOSIS — Z1231 Encounter for screening mammogram for malignant neoplasm of breast: Secondary | ICD-10-CM

## 2015-06-15 DIAGNOSIS — Z9882 Breast implant status: Secondary | ICD-10-CM

## 2015-07-06 ENCOUNTER — Telehealth: Payer: Self-pay | Admitting: Interventional Cardiology

## 2015-07-06 MED ORDER — METOPROLOL TARTRATE 50 MG PO TABS: 50.0000 mg | ORAL_TABLET | Freq: Two times a day (BID) | ORAL | Status: DC

## 2015-07-06 NOTE — Telephone Encounter (Signed)
°*  STAT* If patient is at the pharmacy, call can be transferred to refill team.   1. Which medications need to be refilled? (please list name of each medication and dose if known) Metoprolol 50mg    2. Which pharmacy/location (including street and city if local pharmacy) is medication to be sent to?[facility]  3. Do they need a 30 day or 90 day supply? 90days

## 2015-07-06 NOTE — Telephone Encounter (Signed)
Pt's Rx sent to pt's pharmacy requested.

## 2015-12-01 ENCOUNTER — Other Ambulatory Visit: Payer: Self-pay | Admitting: Interventional Cardiology

## 2015-12-05 NOTE — Progress Notes (Signed)
Cardiology Office Note    Date:  12/06/2015   ID:  April Robertson, DOB 10/31/1955, MRN [id number]  PCP:  Lillia MountainGRIFFIN,JOHN JOSEPH, MD  Cardiologist:  Dr. Katrinka BlazingSmith    SOB and palpitations  History of Present Illness:  April BurlyKelly Robertson is a 60 y.o. female MVP and MR s/p mitral valve repair (2010) and PSVT who presents to clinic for evaluation of SOB and palpitations.   She has a history of mitral valve repair with [facility] Silvestre [facility]Glower at [facility]. Per review of chart, her last 2D ECHO in 2011 showed LVEF greater than 70% and mild mitral regurgitation; however, I cannot find a copy of this report.  She has a history of PSVT and was on BB maintenance therapy. She was last seen by Dr. Katrinka BlazingSmith on 11/02/2014 for follow up. Her palpitations had been quiescent and she wanted to be off the metoprolol. They instead decided to decrease metop tart from 75mg  BID --> 50mg  BID with plans to slowly titrate her off if she did well. However, she has continued on 50mg  BID.   Today she presents to clinic for evaluation of SOB and palpitations. She has also noted decreased exercise tolerance. She walks with her husband almost everyday and notices that she cannot keep up anymore. No exertional chest pain but she does have exertional dyspnea. No LE edema, orthopnea or PND. No dizziness or syncope. 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 She was started on Celexa by her PCP in 05/2015. This has not helped her at all. She recently self increased her Lopressor twice a day from 50 mg to 75 mg twice a day. She thinks this has helped her palpitations.   Past Medical History  Diagnosis Date  . Tachycardia   . Anxiety   . Mitral valve regurgitation   . Hypertension   . Mitral valve prolapse     history of mitral valve prolapse and mitral valve repair 2010. 2-D echo 2011 with LVEF greater than 70% and mild mitral regurgitation  . Insomnia   . Heart valve replaced   . Osteoarthritis   . SVT (supraventricular  tachycardia) [facility](HCC)     Past Surgical History  Procedure Laterality Date  . Mitral valve repair  2010    minimally invasive  . Breast surgery      bilateral breast implants  . Knee arthroplasty Right 11/23/2012    Procedure: COMPUTER ASSISTED TOTAL KNEE ARTHROPLASTY;  Surgeon: Harvie JuniorJohn L Graves, MD;  Location: [facility] OR;  Service: Orthopedics;  Laterality: Right;  GENERAL WITH PRE OP FEMORALL NERVE BLOCK    Current Medications: Outpatient Prescriptions Prior to Visit  Medication Sig Dispense Refill  . diphenhydrAMINE (BENADRYL) 25 MG tablet Take 12.5 mg by mouth at bedtime as needed for sleep.    . metoprolol (LOPRESSOR) 50 MG tablet Take 1 tablet (50 mg total) by mouth 2 (two) times daily. 180 tablet 0  . metoprolol (LOPRESSOR) 50 MG tablet TAKE 1 TABLET(50 MG) BY MOUTH TWICE DAILY 60 tablet 0   No facility-administered medications prior to visit.     Allergies:   Review of patient's allergies indicates no known allergies.   Social History   Social History  . Marital Status: Married    Spouse Name: N/A  . Number of Children: N/A  . Years of Education: N/A   Social History Main Topics  . Smoking status: Never Smoker   . Smokeless tobacco: None  . Alcohol Use: 1.2 oz/week  2 Glasses of wine per week  . Drug Use: No  . Sexual Activity: Not Asked   Other Topics Concern  . None   Social History Narrative     Family History:  The patient'sShe was adopted. Family history is unknown by patient.   ROS:   Please see the history of present illness.    ROS All other systems reviewed and are negative.   PHYSICAL EXAM:   VS:  BP 116/78 mmHg  Pulse 68  Ht  (1.6 m)  Wt 136 lb 6.4 oz (61.871 kg)  BMI 24.17 kg/m2  LMP 06/25/2012   GEN: Well nourished, well developed, in no acute distress HEENT: normal Neck: no JVD, carotid bruits, or masses Cardiac: RRR; no murmurs, rubs, or gallops,no edema  Respiratory:  clear to auscultation bilaterally, normal work of breathing GI:  soft, nontender, nondistended, + BS MS: no deformity or atrophy Skin: warm and dry, no rash Neuro:  Alert and Oriented x 3, Strength and sensation are intact Psych: euthymic mood, full affect  Wt Readings from Last 3 Encounters:  12/06/15 136 lb 6.4 oz (61.871 kg)  11/14/14 133 lb (60.328 kg)  06/18/13 136 lb 1.9 oz (61.744 kg)      Studies/Labs Reviewed:   EKG:  EKG is ordered today.  The ekg ordered today demonstrates NSR HR 68.   Recent Labs: No results found for requested labs within last 365 days.   Lipid Panel No results found for: CHOL, TRIG, HDL, CHOLHDL, VLDL, LDLCALC, LDLDIRECT  Additional studies/ records that were reviewed today include:  2D ECHO in 2011 showed LVEF greater than 70% and mild mitral regurgitation; however, i cannot find a copy of this report.  06/2013: 28 hour Holter: NSR- overall normal study.     ASSESSMENT:    1. Paroxysmal supraventricular tachycardia (HCC)   2. Exertional dyspnea   3. H/O mitral valve repair   4. Anxiety      PLAN:  In order of problems listed above:   Palpitations/Hx of SVT:  Likely recurrent SVT on decreased BB therapy. Will increase metoprolol back to 75 mg twice a day.  Hx of MVP s/p MV repiar at [facility]: I do not see a recent 2D ECHO. With her history of valve repair and SOB, I will update a 2D ECHO  Exertional SOB: as above, proceeed with 2D ECHO. She has very few risk factors for CAD, but was adopted and does not know family history. Will proceed with an ETT Myoview.  Anxiety: She does have a lot of stress at work and she thinks this is exacerbating things. She was recently started on Celexa by her PCP. This is not helping. I advised her to follow-up with Dr. Valentina Lucks about trialing another antidepressant.   Medication Adjustments/Labs and Tests Ordered: Current medicines are reviewed at length with the patient today.  Concerns regarding medicines are outlined above.  Medication changes, Labs and Tests ordered  today are listed in the Patient Instructions below. Patient Instructions  Medication Instructions:  Your physician has recommended you make the following change in your medication:  1.  INCREASE the Metoprolol to 75 mg taking 1 tablet twice a day   Labwork: None ordered  Testing/Procedures: Your physician has requested that you have an echocardiogram. Echocardiography is a painless test that uses sound waves to create images of your heart. It provides your doctor with information about the size and shape of your heart and how well your heart's chambers and valves are  working. This procedure takes approximately one hour. There are no restrictions for this procedure.   Your physician has requested that you have en exercise stress myoview. For further information please visit [email or web address] Please follow instruction sheet, as given.   Follow-Up: Your physician wants you to follow-up in: 6 MONTHS WITH DR. Marlou Starks will receive a reminder letter in the mail two months in advance. If you don't receive a letter, please call our office to schedule the follow-up appointment.    Any Other Special Instructions Will Be Listed Below (If Applicable). Echocardiogram An echocardiogram, or echocardiography, uses sound waves (ultrasound) to produce an image of your heart. The echocardiogram is simple, painless, obtained within a short period of time, and offers valuable information to your health care provider. The images from an echocardiogram can provide information such as:  Evidence of coronary artery disease (CAD).  Heart size.  Heart muscle function.  Heart valve function.  Aneurysm detection.  Evidence of a past heart attack.  Fluid buildup around the heart.  Heart muscle thickening.  Assess heart valve function. LET [facility] CARE PROVIDER KNOW ABOUT:  Any allergies you have.  All medicines you are taking, including vitamins, herbs, eye drops, creams, and  over-the-counter medicines.  Previous problems you or members of your family have had with the use of anesthetics.  Any blood disorders you have.  Previous surgeries you have had.  Medical conditions you have.  Possibility of pregnancy, if this applies. BEFORE THE PROCEDURE  No special preparation is needed. Eat and drink normally.  PROCEDURE   In order to produce an image of your heart, gel will be applied to your chest and a wand-like tool (transducer) will be moved over your chest. The gel will help transmit the sound waves from the transducer. The sound waves will harmlessly bounce off your heart to allow the heart images to be captured in real-time motion. These images will then be recorded.  You may need an IV to receive a medicine that improves the quality of the pictures. AFTER THE PROCEDURE You may return to your normal schedule including diet, activities, and medicines, unless your health care provider tells you otherwise.   This information is not intended to replace advice given to you by your health care provider. Make sure you discuss any questions you have with your health care provider.   Document Released: 08/02/2000 Document Revised: 08/26/2014 Document Reviewed: 04/12/2013 Elsevier Interactive Patient Education [employer]     If you need a refill on your cardiac medications before your next appointment, please call your pharmacy.       Charlestine Massed  12/06/2015 10:17 AM    [facility] [address] [place], [place], [place]  [address] Phone: [phone]; Fax: [phone]

## 2015-12-06 ENCOUNTER — Encounter: Payer: Self-pay | Admitting: Physician Assistant

## 2015-12-06 ENCOUNTER — Ambulatory Visit (INDEPENDENT_AMBULATORY_CARE_PROVIDER_SITE_OTHER): Payer: BLUE CROSS/BLUE SHIELD | Admitting: Physician Assistant

## 2015-12-06 VITALS — BP 116/78 | HR 68 | Ht 63.0 in | Wt 136.4 lb

## 2015-12-06 DIAGNOSIS — R0609 Other forms of dyspnea: Secondary | ICD-10-CM

## 2015-12-06 DIAGNOSIS — I471 Supraventricular tachycardia: Secondary | ICD-10-CM

## 2015-12-06 DIAGNOSIS — Z9889 Other specified postprocedural states: Secondary | ICD-10-CM

## 2015-12-06 DIAGNOSIS — R0600 Dyspnea, unspecified: Secondary | ICD-10-CM

## 2015-12-06 DIAGNOSIS — F419 Anxiety disorder, unspecified: Secondary | ICD-10-CM

## 2015-12-06 MED ORDER — METOPROLOL TARTRATE 75 MG PO TABS: 75.0000 mg | ORAL_TABLET | Freq: Two times a day (BID) | ORAL | Status: DC

## 2015-12-06 NOTE — Patient Instructions (Addendum)
Medication Instructions:  Your physician has recommended you make the following change in your medication:  1.  INCREASE the Metoprolol to 75 mg taking 1 tablet twice a day   Labwork: None ordered  Testing/Procedures: Your physician has requested that you have an echocardiogram. Echocardiography is a painless test that uses sound waves to create images of your heart. It provides your doctor with information about the size and shape of your heart and how well your heart's chambers and valves are working. This procedure takes approximately one hour. There are no restrictions for this procedure.   Your physician has requested that you have en exercise stress myoview. For further information please visit [email or web address] Please follow instruction sheet, as given.   Follow-Up: Your physician wants you to follow-up in: 6 MONTHS WITH DR. Marlou StarksSMITH You will receive a reminder letter in the mail two months in advance. If you don't receive a letter, please call our office to schedule the follow-up appointment.    Any Other Special Instructions Will Be Listed Below (If Applicable). Echocardiogram An echocardiogram, or echocardiography, uses sound waves (ultrasound) to produce an image of your heart. The echocardiogram is simple, painless, obtained within a short period of time, and offers valuable information to your health care provider. The images from an echocardiogram can provide information such as:  Evidence of coronary artery disease (CAD).  Heart size.  Heart muscle function.  Heart valve function.  Aneurysm detection.  Evidence of a past heart attack.  Fluid buildup around the heart.  Heart muscle thickening.  Assess heart valve function. LET [facility] PROVIDER KNOW ABOUT:  Any allergies you have.  All medicines you are taking, including vitamins, herbs, eye drops, creams, and over-the-counter medicines.  Previous problems you or members of your family have had  with the use of anesthetics.  Any blood disorders you have.  Previous surgeries you have had.  Medical conditions you have.  Possibility of pregnancy, if this applies. BEFORE THE PROCEDURE  No special preparation is needed. Eat and drink normally.  PROCEDURE   In order to produce an image of your heart, gel will be applied to your chest and a wand-like tool (transducer) will be moved over your chest. The gel will help transmit the sound waves from the transducer. The sound waves will harmlessly bounce off your heart to allow the heart images to be captured in real-time motion. These images will then be recorded.  You may need an IV to receive a medicine that improves the quality of the pictures. AFTER THE PROCEDURE You may return to your normal schedule including diet, activities, and medicines, unless your health care provider tells you otherwise.   This information is not intended to replace advice given to you by your health care provider. Make sure you discuss any questions you have with your health care provider.   Document Released: 08/02/2000 Document Revised: 08/26/2014 Document Reviewed: 04/12/2013 Elsevier Interactive Patient Education [employer] Elsevier Inc.     If you need a refill on your cardiac medications before your next appointment, please call your pharmacy.

## 2015-12-18 ENCOUNTER — Telehealth (HOSPITAL_COMMUNITY): Payer: Self-pay | Admitting: *Deleted

## 2015-12-18 NOTE — Telephone Encounter (Signed)
Patient given detailed instructions per Myocardial Perfusion Study Information Sheet for the test on 12/20/15. Patient notified to arrive 15 minutes early and that it is imperative to arrive on time for appointment to keep from having the test rescheduled.  If you need to cancel or reschedule your appointment, please call the office within 24 hours of your appointment. Failure to do so may result in a cancellation of your appointment, and a $50 no show fee. Patient verbalized understanding.Joab Carden J Laquanta Hummel, RN  

## 2015-12-20 ENCOUNTER — Other Ambulatory Visit: Payer: Self-pay

## 2015-12-20 ENCOUNTER — Ambulatory Visit (HOSPITAL_BASED_OUTPATIENT_CLINIC_OR_DEPARTMENT_OTHER): Payer: BLUE CROSS/BLUE SHIELD

## 2015-12-20 ENCOUNTER — Ambulatory Visit (HOSPITAL_COMMUNITY): Payer: BLUE CROSS/BLUE SHIELD | Attending: Physician Assistant

## 2015-12-20 VITALS — Ht 63.0 in | Wt 136.0 lb

## 2015-12-20 DIAGNOSIS — R0609 Other forms of dyspnea: Secondary | ICD-10-CM | POA: Diagnosis not present

## 2015-12-20 DIAGNOSIS — I119 Hypertensive heart disease without heart failure: Secondary | ICD-10-CM | POA: Insufficient documentation

## 2015-12-20 DIAGNOSIS — Z9889 Other specified postprocedural states: Secondary | ICD-10-CM | POA: Diagnosis not present

## 2015-12-20 DIAGNOSIS — I471 Supraventricular tachycardia: Secondary | ICD-10-CM

## 2015-12-20 DIAGNOSIS — R002 Palpitations: Secondary | ICD-10-CM | POA: Insufficient documentation

## 2015-12-20 DIAGNOSIS — R0602 Shortness of breath: Secondary | ICD-10-CM | POA: Diagnosis not present

## 2015-12-20 DIAGNOSIS — I071 Rheumatic tricuspid insufficiency: Secondary | ICD-10-CM | POA: Diagnosis not present

## 2015-12-20 DIAGNOSIS — I340 Nonrheumatic mitral (valve) insufficiency: Secondary | ICD-10-CM | POA: Insufficient documentation

## 2015-12-20 DIAGNOSIS — R0600 Dyspnea, unspecified: Secondary | ICD-10-CM

## 2015-12-20 LAB — MYOCARDIAL PERFUSION IMAGING
CHL CUP MPHR: 162 {beats}/min
CHL CUP NUCLEAR SDS: 0
CHL CUP NUCLEAR SRS: 8
CHL CUP NUCLEAR SSS: 8
CHL RATE OF PERCEIVED EXERTION: 18
CSEPHR: 100 %
Estimated workload: 7 METS
Exercise duration (min): 5 min
Exercise duration (sec): 30 s
LHR: 0.21
LV sys vol: 24 mL
LVDIAVOL: 74 mL (ref 46–106)
Peak HR: 162 {beats}/min
Rest HR: 84 {beats}/min
TID: 1.1

## 2015-12-20 MED ORDER — TECHNETIUM TC 99M SESTAMIBI GENERIC - CARDIOLITE
10.0000 | Freq: Once | INTRAVENOUS | Status: AC | PRN
  Administered 2015-12-20: 10 via INTRAVENOUS

## 2015-12-20 MED ORDER — METOPROLOL TARTRATE 5 MG/5ML IV SOLN
2.5000 mg | INTRAVENOUS | Status: AC | PRN
  Administered 2015-12-20: 2.5 mg via INTRAVENOUS

## 2015-12-20 MED ORDER — TECHNETIUM TC 99M SESTAMIBI GENERIC - CARDIOLITE
30.0000 | Freq: Once | INTRAVENOUS | Status: AC | PRN
  Administered 2015-12-20: 30 via INTRAVENOUS

## 2015-12-21 ENCOUNTER — Ambulatory Visit (INDEPENDENT_AMBULATORY_CARE_PROVIDER_SITE_OTHER): Payer: BLUE CROSS/BLUE SHIELD

## 2015-12-21 DIAGNOSIS — I471 Supraventricular tachycardia: Secondary | ICD-10-CM

## 2015-12-21 NOTE — Addendum Note (Signed)
Addended by: Demetrios LollVIA, PATRICIA M on: 12/21/2015 03:06 PM   Modules accepted: Level of Service

## 2015-12-21 NOTE — Patient Instructions (Signed)
The pt arrives in the office for a EKG per Dr Mayford Knifeurner. The pt had a myoview yesterday and went into SVT at a rate of 160 bpm. She was given 5 mg IV Lopressor while in the office and advised to return today for a EKG. EKG obtained and given to Carlean JewsKatie Thompson, PA-c for her review. EKG shows NSR and is ok per Florentina AddisonKatie. Per Dr Mayford Knifeurner the pt has been scheduled to see Norma FredricksonLori Gerhardt, NP on Monday 5/8 at 11:30.

## 2015-12-25 ENCOUNTER — Encounter: Payer: Self-pay | Admitting: Nurse Practitioner

## 2015-12-25 ENCOUNTER — Ambulatory Visit (INDEPENDENT_AMBULATORY_CARE_PROVIDER_SITE_OTHER): Payer: BLUE CROSS/BLUE SHIELD | Admitting: Nurse Practitioner

## 2015-12-25 VITALS — BP 118/80 | HR 58 | Ht 63.0 in | Wt 139.0 lb

## 2015-12-25 DIAGNOSIS — I471 Supraventricular tachycardia: Secondary | ICD-10-CM

## 2015-12-25 DIAGNOSIS — Z9889 Other specified postprocedural states: Secondary | ICD-10-CM | POA: Diagnosis not present

## 2015-12-25 DIAGNOSIS — R0609 Other forms of dyspnea: Secondary | ICD-10-CM | POA: Diagnosis not present

## 2015-12-25 DIAGNOSIS — R0600 Dyspnea, unspecified: Secondary | ICD-10-CM

## 2015-12-25 LAB — CBC
HCT: 37.9 % (ref 35.0–45.0)
Hemoglobin: 12.7 g/dL (ref 11.7–15.5)
MCH: 35.5 pg — ABNORMAL HIGH (ref 27.0–33.0)
MCHC: 33.5 g/dL (ref 32.0–36.0)
MCV: 105.9 fL — ABNORMAL HIGH (ref 80.0–100.0)
MPV: 9.5 fL (ref 7.5–12.5)
Platelets: 208 10*3/uL (ref 140–400)
RBC: 3.58 MIL/uL — ABNORMAL LOW (ref 3.80–5.10)
RDW: 14.4 % (ref 11.0–15.0)
WBC: 5.9 10*3/uL (ref 3.8–10.8)

## 2015-12-25 LAB — BASIC METABOLIC PANEL
BUN: 21 mg/dL (ref 7–25)
CO2: 24 mmol/L (ref 20–31)
Calcium: 9.1 mg/dL (ref 8.6–10.4)
Chloride: 102 mmol/L (ref 98–110)
Creat: 0.56 mg/dL (ref 0.50–1.05)
Glucose, Bld: 77 mg/dL (ref 65–99)
Potassium: 4.3 mmol/L (ref 3.5–5.3)
Sodium: 139 mmol/L (ref 135–146)

## 2015-12-25 LAB — BRAIN NATRIURETIC PEPTIDE: Brain Natriuretic Peptide: 118.7 pg/mL — ABNORMAL HIGH (ref <100)

## 2015-12-25 NOTE — Patient Instructions (Addendum)
We will be checking the following labs today - BMET, CBC and BNP   Medication Instructions:    Continue with your current medicines.     Testing/Procedures To Be Arranged:  Sleep study  24 hour monitor  Follow-Up:   We will get your test results and then decide about follow up.     Other Special Instructions:   N/A    If you need a refill on your cardiac medications before your next appointment, please call your pharmacy.   Call the [facility] office at [phone] if you have any questions, problems or concerns.

## 2015-12-25 NOTE — Progress Notes (Signed)
CARDIOLOGY OFFICE NOTE  Date:  12/25/2015     April Robertson Date of Birth: 1956/01/02 Medical Record #[id number]  PCP:  Lillia MountainGRIFFIN,JOHN JOSEPH, MD  Cardiologist:  Katrinka BlazingSmith    Chief Complaint  Patient presents with  . Shortness of Breath  . Cardiac Valve Problem    Follow up visit after Myoview - seen with Dr. Katrinka BlazingSmith    History of Present Illness: April Robertson is a 60 y.o. female who presents today for a post Myoview study. Seen for Dr. Katrinka BlazingSmith.   She has had mitral valve repair 2010 [facility] with Dr. Silvestre MesiGlower. Other issues include tachycardia with prior PSVT, HTN and anxiety.     She was seen here earlier this month with DOE. Has had echo and Myoview. Walked for 5 minutes with her stress test and developed SVT. Her dose of Lopressor had been cut back previously but she has increased it back up to her usual dose.   Comes in today. Here with her husband. She remains short of breath with exertion. Husband notes she snores and has periods where she stops breathing. No leg swelling. No chest pain. Echo and Myoview reviewed. She is under more stress with work obligations. The sensation she had during her Myoview is what she has been feeling at home. Since she is back up on her dose of Lopressor she has done better at night - with less palpitations. No syncope. Husband is worried. No recent labs noted.   Past Medical History  Diagnosis Date  . Tachycardia   . Anxiety   . Mitral valve regurgitation   . Hypertension   . Mitral valve prolapse     history of mitral valve prolapse and mitral valve repair 2010. 2-D echo 2011 with LVEF greater than 70% and mild mitral regurgitation  . Insomnia   . Heart valve replaced   . Osteoarthritis   . SVT (supraventricular tachycardia) [facility](H[facility])     Past Surgical History  Procedure Laterality Date  . Mitral valve repair  2010    minimally invasive  . Breast surgery      bilateral breast implants  . Knee arthroplasty Right 11/23/2012    Procedure:  COMPUTER ASSISTED TOTAL KNEE ARTHROPLASTY;  Surgeon: Harvie JuniorJohn L Graves, MD;  Location: [facility] OR;  Service: Orthopedics;  Laterality: Right;  GENERAL WITH PRE OP FEMORALL NERVE BLOCK     Medications: Current Outpatient Prescriptions  Medication Sig Dispense Refill  . ALPRAZolam (XANAX) 0.5 MG tablet TK 1 T PO  TID PRN. DO NOT MIX WITH ALCOHOL.  0  . citalopram (CELEXA) 20 MG tablet Take 20 mg by mouth daily.  11  . diphenhydrAMINE (BENADRYL) 25 MG tablet Take 12.5 mg by mouth at bedtime as needed for sleep.    . metoprolol 75 MG TABS Take 75 mg by mouth 2 (two) times daily. 180 tablet 3  . OVER THE COUNTER MEDICATION CALMS FORTE SLEEP AID PT TAKES 2 TABLETS BY MOUTH DAILY     No current facility-administered medications for this visit.   Facility-Administered Medications Ordered in Other Visits  Medication Dose Route Frequency Provider Last Rate Last Dose  . metoprolol (LOPRESSOR) injection 2.5 mg  2.5 mg Intravenous Q5 min PRN Quintella Reichertraci R Turner, MD   2.5 mg at 12/20/15 1230    Allergies: No Known Allergies  Social History: The patient  reports that she has never smoked. She does not have any smokeless tobacco history on file. She reports that she drinks about 1.2  oz of alcohol per week. She reports that she does not use illicit drugs.   Family History:  She was adopted. Family history is unknown by patient.   Review of Systems: Please see the history of present illness.   Otherwise, the review of systems is positive for none.   All other systems are reviewed and negative.   Physical Exam: VS:  BP 118/80 mmHg  Pulse 58  Ht  (1.6 m)  Wt 139 lb (63.05 kg)  BMI 24.63 kg/m2  LMP 06/25/2012 .  BMI Body mass index is 24.63 kg/(m^2).  Wt Readings from Last 3 Encounters:  12/25/15 139 lb (63.05 kg)  12/20/15 136 lb (61.689 kg)  12/06/15 136 lb 6.4 oz (61.871 kg)    General: Pleasant. Well developed, well nourished and in no acute distress.  HEENT: Normal. Neck: Supple, no JVD,  carotid bruits, or masses noted.  Cardiac: Regular rate and rhythm. No murmurs, rubs, or gallops. No edema.  Respiratory:  Lungs are clear to auscultation bilaterally with normal work of breathing.  GI: Soft and nontender.  MS: No deformity or atrophy. Gait and ROM intact. Skin: Warm and dry. Color is normal.  Neuro:  Strength and sensation are intact and no gross focal deficits noted.  Psych: Alert, appropriate and with normal affect.   LABORATORY DATA:  EKG:  EKG is not ordered today.  Lab Results  Component Value Date   WBC 6.1 11/25/2012   HGB 9.9* 11/25/2012   HCT 28.6* 11/25/2012   PLT 162 11/25/2012   GLUCOSE 115* 11/24/2012   ALT 30 11/16/2012   AST 34 11/16/2012   NA 137 11/24/2012   K 4.1 11/24/2012   CL 101 11/24/2012   CREATININE 0.69 11/24/2012   BUN 18 11/24/2012   CO2 27 11/24/2012   INR 0.95 11/16/2012    BNP (last 3 results) No results for input(s): BNP in the last 8760 hours.  ProBNP (last 3 results) No results for input(s): PROBNP in the last 8760 hours.   Other Studies Reviewed Today:  Echo Study Conclusions from 2017  - Left ventricle: The cavity size was normal. There was moderate  focal basal hypertrophy of the septum. Systolic function was  normal. The estimated ejection fraction was in the range of 55%  to 60%. Wall motion was normal; there were no regional wall  motion abnormalities. Doppler parameters are consistent with  abnormal left ventricular relaxation (grade 1 diastolic  dysfunction). - Mitral valve: s/p annuloplasty repair. No stenosis. There was  mild regurgitation. - Left atrium: The atrium was mildly dilated. - Tricuspid valve: There was trivial regurgitation. - Pulmonary arteries: PA peak pressure: 24 mm Hg (S). - Systemic veins: The IVC measures >2.1 cm, but collapses >50%,  suggesting an elevated RA pressure of 8 mmHg. - Pericardium, extracardiac: There was no pericardial effusion.  Impressions:  - LVEF  55-60%, moderate focal basal septal hypertrophy, normal wall  motion, diastolic dysfunction, mitral annuloplasty repair with  mild MR, mild LAE, trivial TR, normal RVSP, elevated RA pressure  of 8 mmHg.   Myoview Study Highlights from 12/2015     Nuclear stress EF: 67%.  The study is normal.  This is a low risk study.  The left ventricular ejection fraction is hyperdynamic (>65%).  Mild breast attenuation no ischemia or infarction EF 67% Patient had prolonged SVT during test Rx with iv lopressor      Assessment/Plan: 1. DOE - possible related to SVT - ? HR too slow  with beta blocker. Myoview without ischemia. Echo with good LV function and no increased PA pressures. Needs 24 hour monitor. Needs sleep study. She is subsequently seen with Dr. Katrinka Blazing who has formulated this plan today. Needs baseline labs today as well. Could try and continue with medical management. May need to consider ablation. Further disposition to follow.   2. SVT - see above. Will leave her on her current dose of beta blocker for now until we get her monitor.   3. Probable OSA - needs sleep study.    4. Prior MV repair - stable echo.   Current medicines are reviewed with the patient today.  The patient does not have concerns regarding medicines other than what has been noted above.  The following changes have been made:  See above.  Labs/ tests ordered today include:    Orders Placed This Encounter  Procedures  . Basic metabolic panel  . Brain natriuretic peptide  . CBC  . Holter monitor - 24 hour     Disposition:   Further disposition to follow.   Patient is agreeable to this plan and will call if any problems develop in the interim.   Signed: Rosalio Macadamia, RN, ANP-C 12/25/2015 12:04 PM  [facility] [address] [place], [place]  [address] Phone: [phone] Fax: [phone]

## 2015-12-26 ENCOUNTER — Telehealth: Payer: Self-pay | Admitting: *Deleted

## 2015-12-26 NOTE — Telephone Encounter (Signed)
Patient was to be set up for sleep study after OV with Norma FredricksonLori Gerhardt.  Patient's insurance stated that a lab study was not covered.  Lawson FiscalLori agreed to a home study.  I called patient to discuss and she stated that she would have to talk about this with her husband and she would call me back to let me know if she wanted to do a home study.

## 2015-12-29 DIAGNOSIS — F419 Anxiety disorder, unspecified: Secondary | ICD-10-CM | POA: Diagnosis not present

## 2015-12-29 DIAGNOSIS — R0683 Snoring: Secondary | ICD-10-CM | POA: Diagnosis not present

## 2016-01-04 ENCOUNTER — Ambulatory Visit (INDEPENDENT_AMBULATORY_CARE_PROVIDER_SITE_OTHER): Payer: BLUE CROSS/BLUE SHIELD

## 2016-01-04 DIAGNOSIS — Z9889 Other specified postprocedural states: Secondary | ICD-10-CM | POA: Diagnosis not present

## 2016-01-04 DIAGNOSIS — R0600 Dyspnea, unspecified: Secondary | ICD-10-CM

## 2016-01-04 DIAGNOSIS — I471 Supraventricular tachycardia: Secondary | ICD-10-CM

## 2016-01-04 DIAGNOSIS — R0609 Other forms of dyspnea: Secondary | ICD-10-CM | POA: Diagnosis not present

## 2016-01-18 NOTE — Telephone Encounter (Signed)
Patient stated that she does not want to have a sleep study at this time.  I let her know if she changed her mind she could call me back anytime.  She appreciated the call.

## 2016-02-17 ENCOUNTER — Other Ambulatory Visit: Payer: Self-pay | Admitting: Interventional Cardiology

## 2016-03-14 DIAGNOSIS — M25571 Pain in right ankle and joints of right foot: Secondary | ICD-10-CM | POA: Diagnosis not present

## 2016-06-21 ENCOUNTER — Telehealth: Payer: Self-pay | Admitting: *Deleted

## 2016-06-21 NOTE — Telephone Encounter (Signed)
lvm to schedule appointment per recall with Norma FredricksonLori Gerhardt or Dr. Katrinka BlazingSmith.

## 2016-06-25 ENCOUNTER — Other Ambulatory Visit: Payer: Self-pay | Admitting: Obstetrics & Gynecology

## 2016-06-25 DIAGNOSIS — Z1231 Encounter for screening mammogram for malignant neoplasm of breast: Secondary | ICD-10-CM

## 2016-06-25 DIAGNOSIS — Z9882 Breast implant status: Secondary | ICD-10-CM

## 2016-07-29 ENCOUNTER — Ambulatory Visit
Admission: RE | Admit: 2016-07-29 | Discharge: 2016-07-29 | Disposition: A | Discharge status: Home / Self Care | Payer: BLUE CROSS/BLUE SHIELD | Source: Ambulatory Visit | Attending: Obstetrics & Gynecology | Admitting: Obstetrics & Gynecology

## 2016-07-29 DIAGNOSIS — Z1231 Encounter for screening mammogram for malignant neoplasm of breast: Secondary | ICD-10-CM | POA: Diagnosis not present

## 2016-07-29 DIAGNOSIS — Z9882 Breast implant status: Secondary | ICD-10-CM

## 2016-08-20 DIAGNOSIS — Z01419 Encounter for gynecological examination (general) (routine) without abnormal findings: Secondary | ICD-10-CM | POA: Diagnosis not present

## 2016-08-20 DIAGNOSIS — N951 Menopausal and female climacteric states: Secondary | ICD-10-CM | POA: Diagnosis not present

## 2016-08-20 DIAGNOSIS — R5383 Other fatigue: Secondary | ICD-10-CM | POA: Diagnosis not present

## 2016-08-20 DIAGNOSIS — R51 Headache: Secondary | ICD-10-CM | POA: Diagnosis not present

## 2016-08-20 DIAGNOSIS — R413 Other amnesia: Secondary | ICD-10-CM | POA: Diagnosis not present

## 2016-08-20 DIAGNOSIS — Z6825 Body mass index (BMI) 25.0-25.9, adult: Secondary | ICD-10-CM | POA: Diagnosis not present

## 2016-11-21 DIAGNOSIS — D519 Vitamin B12 deficiency anemia, unspecified: Secondary | ICD-10-CM | POA: Diagnosis not present

## 2016-11-21 DIAGNOSIS — N951 Menopausal and female climacteric states: Secondary | ICD-10-CM | POA: Diagnosis not present

## 2017-01-24 ENCOUNTER — Other Ambulatory Visit: Payer: Self-pay | Admitting: Physician Assistant

## 2017-02-13 DIAGNOSIS — E7800 Pure hypercholesterolemia, unspecified: Secondary | ICD-10-CM | POA: Diagnosis not present

## 2017-02-13 DIAGNOSIS — F439 Reaction to severe stress, unspecified: Secondary | ICD-10-CM | POA: Diagnosis not present

## 2017-02-13 DIAGNOSIS — Z0000 Encounter for general adult medical examination without abnormal findings: Secondary | ICD-10-CM | POA: Diagnosis not present

## 2017-02-13 DIAGNOSIS — F411 Generalized anxiety disorder: Secondary | ICD-10-CM | POA: Diagnosis not present

## 2017-02-25 ENCOUNTER — Other Ambulatory Visit: Payer: Self-pay | Admitting: Physician Assistant

## 2017-03-12 ENCOUNTER — Other Ambulatory Visit: Payer: Self-pay | Admitting: Physician Assistant

## 2017-03-16 ENCOUNTER — Other Ambulatory Visit: Payer: Self-pay | Admitting: Physician Assistant

## 2017-06-23 ENCOUNTER — Ambulatory Visit
Admission: RE | Admit: 2017-06-23 | Discharge: 2017-06-23 | Disposition: A | Discharge status: Home / Self Care | Payer: BLUE CROSS/BLUE SHIELD | Source: Ambulatory Visit | Attending: Internal Medicine | Admitting: Internal Medicine

## 2017-06-23 ENCOUNTER — Other Ambulatory Visit: Payer: Self-pay | Admitting: Internal Medicine

## 2017-06-23 DIAGNOSIS — R0600 Dyspnea, unspecified: Secondary | ICD-10-CM

## 2017-06-23 DIAGNOSIS — R0602 Shortness of breath: Secondary | ICD-10-CM | POA: Diagnosis not present

## 2017-06-23 DIAGNOSIS — R0609 Other forms of dyspnea: Secondary | ICD-10-CM | POA: Diagnosis not present

## 2017-06-23 DIAGNOSIS — F419 Anxiety disorder, unspecified: Secondary | ICD-10-CM | POA: Diagnosis not present

## 2017-07-02 DIAGNOSIS — M9902 Segmental and somatic dysfunction of thoracic region: Secondary | ICD-10-CM | POA: Diagnosis not present

## 2017-07-02 DIAGNOSIS — M9901 Segmental and somatic dysfunction of cervical region: Secondary | ICD-10-CM | POA: Diagnosis not present

## 2017-07-02 DIAGNOSIS — M9903 Segmental and somatic dysfunction of lumbar region: Secondary | ICD-10-CM | POA: Diagnosis not present

## 2017-07-02 DIAGNOSIS — M9905 Segmental and somatic dysfunction of pelvic region: Secondary | ICD-10-CM | POA: Diagnosis not present

## 2017-07-18 DIAGNOSIS — R635 Abnormal weight gain: Secondary | ICD-10-CM | POA: Diagnosis not present

## 2017-07-18 DIAGNOSIS — N951 Menopausal and female climacteric states: Secondary | ICD-10-CM | POA: Diagnosis not present

## 2017-07-22 DIAGNOSIS — R7301 Impaired fasting glucose: Secondary | ICD-10-CM | POA: Diagnosis not present

## 2017-07-22 DIAGNOSIS — E559 Vitamin D deficiency, unspecified: Secondary | ICD-10-CM | POA: Diagnosis not present

## 2017-07-22 DIAGNOSIS — E663 Overweight: Secondary | ICD-10-CM | POA: Diagnosis not present

## 2017-07-22 DIAGNOSIS — E782 Mixed hyperlipidemia: Secondary | ICD-10-CM | POA: Diagnosis not present

## 2017-08-04 ENCOUNTER — Other Ambulatory Visit: Payer: Self-pay | Admitting: Internal Medicine

## 2017-08-04 DIAGNOSIS — Z1231 Encounter for screening mammogram for malignant neoplasm of breast: Secondary | ICD-10-CM

## 2017-08-05 ENCOUNTER — Ambulatory Visit: Payer: Self-pay | Admitting: Licensed Clinical Social Worker

## 2017-08-29 DIAGNOSIS — Z1211 Encounter for screening for malignant neoplasm of colon: Secondary | ICD-10-CM | POA: Diagnosis not present

## 2017-08-29 DIAGNOSIS — Z01818 Encounter for other preprocedural examination: Secondary | ICD-10-CM | POA: Diagnosis not present

## 2017-09-03 ENCOUNTER — Ambulatory Visit
Admission: RE | Admit: 2017-09-03 | Discharge: 2017-09-03 | Disposition: A | Discharge status: Home / Self Care | Payer: BLUE CROSS/BLUE SHIELD | Source: Ambulatory Visit | Attending: Internal Medicine | Admitting: Internal Medicine

## 2017-09-03 DIAGNOSIS — E559 Vitamin D deficiency, unspecified: Secondary | ICD-10-CM | POA: Diagnosis not present

## 2017-09-03 DIAGNOSIS — Z1231 Encounter for screening mammogram for malignant neoplasm of breast: Secondary | ICD-10-CM | POA: Diagnosis not present

## 2017-09-03 DIAGNOSIS — E663 Overweight: Secondary | ICD-10-CM | POA: Diagnosis not present

## 2017-09-10 DIAGNOSIS — E663 Overweight: Secondary | ICD-10-CM | POA: Diagnosis not present

## 2017-09-10 DIAGNOSIS — N951 Menopausal and female climacteric states: Secondary | ICD-10-CM | POA: Diagnosis not present

## 2017-09-10 DIAGNOSIS — E559 Vitamin D deficiency, unspecified: Secondary | ICD-10-CM | POA: Diagnosis not present

## 2017-09-10 DIAGNOSIS — R7301 Impaired fasting glucose: Secondary | ICD-10-CM | POA: Diagnosis not present

## 2017-09-11 ENCOUNTER — Ambulatory Visit: Payer: Self-pay | Admitting: Licensed Clinical Social Worker

## 2017-09-16 ENCOUNTER — Ambulatory Visit (INDEPENDENT_AMBULATORY_CARE_PROVIDER_SITE_OTHER): Payer: BLUE CROSS/BLUE SHIELD | Admitting: Licensed Clinical Social Worker

## 2017-09-16 DIAGNOSIS — F419 Anxiety disorder, unspecified: Secondary | ICD-10-CM

## 2017-09-17 ENCOUNTER — Ambulatory Visit: Payer: BLUE CROSS/BLUE SHIELD | Admitting: Licensed Clinical Social Worker

## 2017-09-17 ENCOUNTER — Ambulatory Visit (INDEPENDENT_AMBULATORY_CARE_PROVIDER_SITE_OTHER): Payer: BLUE CROSS/BLUE SHIELD | Admitting: Obstetrics & Gynecology

## 2017-09-17 ENCOUNTER — Encounter: Payer: Self-pay | Admitting: Obstetrics & Gynecology

## 2017-09-17 VITALS — BP 132/84 | Ht 62.0 in | Wt 151.0 lb

## 2017-09-17 DIAGNOSIS — Z01411 Encounter for gynecological examination (general) (routine) with abnormal findings: Secondary | ICD-10-CM | POA: Diagnosis not present

## 2017-09-17 DIAGNOSIS — Z780 Asymptomatic menopausal state: Secondary | ICD-10-CM

## 2017-09-17 DIAGNOSIS — Z13820 Encounter for screening for osteoporosis: Secondary | ICD-10-CM | POA: Diagnosis not present

## 2017-09-17 DIAGNOSIS — E663 Overweight: Secondary | ICD-10-CM | POA: Diagnosis not present

## 2017-09-17 DIAGNOSIS — N952 Postmenopausal atrophic vaginitis: Secondary | ICD-10-CM | POA: Diagnosis not present

## 2017-09-17 NOTE — Addendum Note (Signed)
Addended by: Berna SpareASTILLO, Odysseus Cada A on: 09/17/2017 12:25 PM   Modules accepted: Orders

## 2017-09-17 NOTE — Patient Instructions (Signed)
1. Encounter for gynecological examination with abnormal finding Normal gynecologic exam.  Pap reflex done.  Breast exam normal.  Screening mammogram negative January 2019.  Schedule for screening colonoscopy next week.  Health labs with family physician.  2. Menopause present Well on no hormone replacement therapy.  No postmenopausal bleeding.  Recommend Kegel exercises to maintain a good strong pelvic floor.  Instructions on Kegels given.  3. Post-menopause atrophic vaginitis Mild postmenopausal atrophic vaginitis.  Some feeling of dryness with intercourse.  Will try Astro glide or coconut oil when sexually active.  4. Overweight (BMI 25.0-29.9) Low calorie/low carb diet discussed.  Recommend [employer].  I aerobic physical activity more than 5 times a week with weight lifting every 2 days recommended.  5. Screening for osteoporosis Vitamin D supplements, calcium rich nutrition and weightbearing physical activity regularly recommended.  Patient will follow up here for bone density. - DG Bone Density; Future  April Robertson, it was a pleasure seeing you today!  I will inform you of your results as soon as they are available.   Kegel Exercises Kegel exercises help strengthen the muscles that support the rectum, vagina, small intestine, bladder, and uterus. Doing Kegel exercises can help:  Improve bladder and bowel control.  Improve sexual response.  Reduce problems and discomfort during pregnancy.  Kegel exercises involve squeezing your pelvic floor muscles, which are the same muscles you squeeze when you try to stop the flow of urine. The exercises can be done while sitting, standing, or lying down, but it is best to vary your position. Phase 1 exercises 1. Squeeze your pelvic floor muscles tight. You should feel a tight lift in your rectal area. If you are a female, you should also feel a tightness in your vaginal area. Keep your stomach, buttocks, and legs relaxed. 2. Hold the muscles  tight for up to 10 seconds. 3. Relax your muscles. Repeat this exercise 50 times a day or as many times as told by your health care provider. Continue to do this exercise for at least 4-6 weeks or for as long as told by your health care provider. This information is not intended to replace advice given to you by your health care provider. Make sure you discuss any questions you have with your health care provider. Document Released: 07/22/2012 Document Revised: 03/30/2016 Document Reviewed: 06/25/2015 Elsevier Interactive Patient Education  2018 [employer] [employer] Maintenance for Postmenopausal Women Menopause is a normal process in which your reproductive ability comes to an end. This process happens gradually over a span of months to years, usually between the ages of 34 and 77. Menopause is complete when you have missed 12 consecutive menstrual periods. It is important to talk with your health care provider about some of the most common conditions that affect postmenopausal women, such as heart disease, cancer, and bone loss (osteoporosis). Adopting a healthy lifestyle and getting preventive care can help to promote your health and wellness. Those actions can also lower your chances of developing some of these common conditions. What should I know about menopause? During menopause, you may experience a number of symptoms, such as:  Moderate-to-severe hot flashes.  Night sweats.  Decrease in sex drive.  Mood swings.  Headaches.  Tiredness.  Irritability.  Memory problems.  Insomnia.  Choosing to treat or not to treat menopausal changes is an individual decision that you make with your health care provider. What should I know about hormone replacement therapy and supplements? Hormone therapy products are effective  for treating symptoms that are associated with menopause, such as hot flashes and night sweats. Hormone replacement carries certain risks, especially as you become  older. If you are thinking about using estrogen or estrogen with progestin treatments, discuss the benefits and risks with your health care provider. What should I know about heart disease and stroke? Heart disease, heart attack, and stroke become more likely as you age. This may be due, in part, to the hormonal changes that your body experiences during menopause. These can affect how your body processes dietary fats, triglycerides, and cholesterol. Heart attack and stroke are both medical emergencies. There are many things that you can do to help prevent heart disease and stroke:  Have your blood pressure checked at least every 1-2 years. High blood pressure causes heart disease and increases the risk of stroke.  If you are 53-28 years old, ask your health care provider if you should take aspirin to prevent a heart attack or a stroke.  Do not use any tobacco products, including cigarettes, chewing tobacco, or electronic cigarettes. If you need help quitting, ask your health care provider.  It is important to eat a healthy diet and maintain a healthy weight. ? Be sure to include plenty of vegetables, fruits, low-fat dairy products, and lean protein. ? Avoid eating foods that are high in solid fats, added sugars, or salt (sodium).  Get regular exercise. This is one of the most important things that you can do for your health. ? Try to exercise for at least 150 minutes each week. The type of exercise that you do should increase your heart rate and make you sweat. This is known as moderate-intensity exercise. ? Try to do strengthening exercises at least twice each week. Do these in addition to the moderate-intensity exercise.  Know your numbers.Ask your health care provider to check your cholesterol and your blood glucose. Continue to have your blood tested as directed by your health care provider.  What should I know about cancer screening? There are several types of cancer. Take the following  steps to reduce your risk and to catch any cancer development as early as possible. Breast Cancer  Practice breast self-awareness. ? This means understanding how your breasts normally appear and feel. ? It also means doing regular breast self-exams. Let your health care provider know about any changes, no matter how small.  If you are 4 or older, have a clinician do a breast exam (clinical breast exam or CBE) every year. Depending on your age, family history, and medical history, it may be recommended that you also have a yearly breast X-ray (mammogram).  If you have a family history of breast cancer, talk with your health care provider about genetic screening.  If you are at high risk for breast cancer, talk with your health care provider about having an MRI and a mammogram every year.  Breast cancer (BRCA) gene test is recommended for women who have family members with BRCA-related cancers. Results of the assessment will determine the need for genetic counseling and BRCA1 and for BRCA2 testing. BRCA-related cancers include these types: ? Breast. This occurs in males or females. ? Ovarian. ? Tubal. This may also be called fallopian tube cancer. ? Cancer of the abdominal or pelvic lining (peritoneal cancer). ? Prostate. ? Pancreatic.  Cervical, Uterine, and Ovarian Cancer Your health care provider may recommend that you be screened regularly for cancer of the pelvic organs. These include your ovaries, uterus, and vagina. This screening involves  a pelvic exam, which includes checking for microscopic changes to the surface of your cervix (Pap test).  For women ages 21-65, health care providers may recommend a pelvic exam and a Pap test every three years. For women ages 22-65, they may recommend the Pap test and pelvic exam, combined with testing for human papilloma virus (HPV), every five years. Some types of HPV increase your risk of cervical cancer. Testing for HPV may also be done on women  of any age who have unclear Pap test results.  Other health care providers may not recommend any screening for nonpregnant women who are considered low risk for pelvic cancer and have no symptoms. Ask your health care provider if a screening pelvic exam is right for you.  If you have had past treatment for cervical cancer or a condition that could lead to cancer, you need Pap tests and screening for cancer for at least 20 years after your treatment. If Pap tests have been discontinued for you, your risk factors (such as having a new sexual partner) need to be reassessed to determine if you should start having screenings again. Some women have medical problems that increase the chance of getting cervical cancer. In these cases, your health care provider may recommend that you have screening and Pap tests more often.  If you have a family history of uterine cancer or ovarian cancer, talk with your health care provider about genetic screening.  If you have vaginal bleeding after reaching menopause, tell your health care provider.  There are currently no reliable tests available to screen for ovarian cancer.  Lung Cancer Lung cancer screening is recommended for adults 30-83 years old who are at high risk for lung cancer because of a history of smoking. A yearly low-dose CT scan of the lungs is recommended if you:  Currently smoke.  Have a history of at least 30 pack-years of smoking and you currently smoke or have quit within the past 15 years. A pack-year is smoking an average of one pack of cigarettes per day for one year.  Yearly screening should:  Continue until it has been 15 years since you quit.  Stop if you develop a health problem that would prevent you from having lung cancer treatment.  Colorectal Cancer  This type of cancer can be detected and can often be prevented.  Routine colorectal cancer screening usually begins at age 65 and continues through age 5.  If you have risk  factors for colon cancer, your health care provider may recommend that you be screened at an earlier age.  If you have a family history of colorectal cancer, talk with your health care provider about genetic screening.  Your health care provider may also recommend using home test kits to check for hidden blood in your stool.  A small camera at the end of a tube can be used to examine your colon directly (sigmoidoscopy or colonoscopy). This is done to check for the earliest forms of colorectal cancer.  Direct examination of the colon should be repeated every 5-10 years until age 67. However, if early forms of precancerous polyps or small growths are found or if you have a family history or genetic risk for colorectal cancer, you may need to be screened more often.  Skin Cancer  Check your skin from head to toe regularly.  Monitor any moles. Be sure to tell your health care provider: ? About any new moles or changes in moles, especially if there is a  change in a mole's shape or color. ? If you have a mole that is larger than the size of a pencil eraser.  If any of your family members has a history of skin cancer, especially at a young age, talk with your health care provider about genetic screening.  Always use sunscreen. Apply sunscreen liberally and repeatedly throughout the day.  Whenever you are outside, protect yourself by wearing long sleeves, pants, a wide-brimmed hat, and sunglasses.  What should I know about osteoporosis? Osteoporosis is a condition in which bone destruction happens more quickly than new bone creation. After menopause, you may be at an increased risk for osteoporosis. To help prevent osteoporosis or the bone fractures that can happen because of osteoporosis, the following is recommended:  If you are 15-102 years old, get at least 1,000 mg of calcium and at least 600 mg of vitamin D per day.  If you are older than age 66 but younger than age 1, get at least 1,200  mg of calcium and at least 600 mg of vitamin D per day.  If you are older than age 78, get at least 1,200 mg of calcium and at least 800 mg of vitamin D per day.  Smoking and excessive alcohol intake increase the risk of osteoporosis. Eat foods that are rich in calcium and vitamin D, and do weight-bearing exercises several times each week as directed by your health care provider. What should I know about how menopause affects my mental health? Depression may occur at any age, but it is more common as you become older. Common symptoms of depression include:  Low or sad mood.  Changes in sleep patterns.  Changes in appetite or eating patterns.  Feeling an overall lack of motivation or enjoyment of activities that you previously enjoyed.  Frequent crying spells.  Talk with your health care provider if you think that you are experiencing depression. What should I know about immunizations? It is important that you get and maintain your immunizations. These include:  Tetanus, diphtheria, and pertussis (Tdap) booster vaccine.  Influenza every year before the flu season begins.  Pneumonia vaccine.  Shingles vaccine.  Your health care provider may also recommend other immunizations. This information is not intended to replace advice given to you by your health care provider. Make sure you discuss any questions you have with your health care provider. Document Released: 09/27/2005 Document Revised: 02/23/2016 Document Reviewed: 05/09/2015 Elsevier Interactive Patient Education  2018 [employer].

## 2017-09-17 NOTE — Progress Notes (Signed)
April BurlyKelly Robertson 06/28/1956 [id number]   History:    62 y.o. G1P1L1 Married  RP:  Established patient presenting for annual gyn exam   HPI:  Menopause, well on no hormone replacement therapy.  No postmenopausal bleeding.  No pelvic pain.  Using [facility].  No pain with intercourse, but sometimes still feels dryness in spite of [place].  Breasts normal.  Urine and bowel movements normal.  Urinary incontinence only when sneezing.  Health labs with family physician.  Body mass index 27.62.  Would like to lose weight.  Past medical history,surgical history, family history and social history were all reviewed and documented in the EPIC chart.  Gynecologic History Patient's last menstrual period was 06/25/2012. Contraception: post menopausal status Last Pap: 2018. Results were: normal per patient.  Will obtain records from [facility]. Last mammogram: January 2019. Results were: Negative Bone Density: Never Colonoscopy: 10 years ago, scheduled for next week.  Obstetric History OB History  Gravida Para Term Preterm AB Living  1 1       1   SAB TAB Ectopic Multiple Live Births               # Outcome Date GA Lbr Len/2nd Weight Sex Delivery Anes PTL Lv  1 Para                ROS: A ROS was performed and pertinent positives and negatives are included in the history.  GENERAL: No fevers or chills. HEENT: No change in vision, no earache, sore throat or sinus congestion. NECK: No pain or stiffness. CARDIOVASCULAR: No chest pain or pressure. No palpitations. PULMONARY: No shortness of breath, cough or wheeze. GASTROINTESTINAL: No abdominal pain, nausea, vomiting or diarrhea, melena or bright red blood per rectum. GENITOURINARY: No urinary frequency, urgency, hesitancy or dysuria. MUSCULOSKELETAL: No joint or muscle pain, no back pain, no recent trauma. DERMATOLOGIC: No rash, no itching, no lesions. ENDOCRINE: No polyuria, polydipsia, no heat or cold intolerance. No recent change in weight.  HEMATOLOGICAL: No anemia or easy bruising or bleeding. NEUROLOGIC: No headache, seizures, numbness, tingling or weakness. PSYCHIATRIC: No depression, no loss of interest in normal activity or change in sleep pattern.     Exam:   BP 132/84   Ht 5\' 2"  (1.575 m)   Wt 151 lb (68.5 kg)   LMP 06/25/2012   BMI 27.62 kg/m   Body mass index is 27.62 kg/m.  General appearance : Well developed well nourished female. No acute distress HEENT: Eyes: no retinal hemorrhage or exudates,  Neck supple, trachea midline, no carotid bruits, no thyroidmegaly Lungs: Clear to auscultation, no rhonchi or wheezes, or rib retractions  Heart: Regular rate and rhythm, no murmurs or gallops Breast:Examined in sitting and supine position were symmetrical in appearance, no palpable masses or tenderness,  no skin retraction, no nipple inversion, no nipple discharge, no skin discoloration, no axillary or supraclavicular lymphadenopathy Abdomen: no palpable masses or tenderness, no rebound or guarding Extremities: no edema or skin discoloration or tenderness  Pelvic: Vulva: Normal             Vagina: No gross lesions or discharge  Cervix: No gross lesions or discharge.  Pap reflex done  Uterus anteverted, normal size, shape and consistency, non-tender and mobile  Adnexa  Without masses or tenderness  Anus: Normal   Assessment/Plan:  62 y.o. female for annual exam   1. Encounter for gynecological examination with abnormal finding Normal gynecologic exam.  Pap reflex done.  Breast exam  normal.  Screening mammogram negative January 2019.  Schedule for screening colonoscopy next week.  Health labs with family physician.  2. Menopause present Well on no hormone replacement therapy.  No postmenopausal bleeding.  Recommend Kegel exercises to maintain a good strong pelvic floor.  Instructions on Kegels given.  3. Post-menopause atrophic vaginitis Mild postmenopausal atrophic vaginitis.  Some feeling of dryness with  intercourse.  Will try Astro glide or coconut oil when sexually active.  4. Overweight (BMI 25.0-29.9) Low calorie/low carb diet discussed.  Recommend [employer].  I aerobic physical activity more than 5 times a week with weight lifting every 2 days recommended.  5. Screening for osteoporosis Vitamin D supplements, calcium rich nutrition and weightbearing physical activity regularly recommended.  Patient will follow up here for bone density. - DG Bone Density; Future  Genia Del MD, 9:43 AM 09/17/2017

## 2017-09-19 DIAGNOSIS — M8580 Other specified disorders of bone density and structure, unspecified site: Secondary | ICD-10-CM

## 2017-09-19 HISTORY — DX: Other specified disorders of bone density and structure, unspecified site: M85.80

## 2017-09-22 LAB — PAP IG W/ RFLX HPV ASCU

## 2017-09-30 ENCOUNTER — Ambulatory Visit: Payer: Self-pay | Admitting: Licensed Clinical Social Worker

## 2017-10-01 DIAGNOSIS — K5730 Diverticulosis of large intestine without perforation or abscess without bleeding: Secondary | ICD-10-CM | POA: Diagnosis not present

## 2017-10-01 DIAGNOSIS — K648 Other hemorrhoids: Secondary | ICD-10-CM | POA: Diagnosis not present

## 2017-10-01 DIAGNOSIS — Z1211 Encounter for screening for malignant neoplasm of colon: Secondary | ICD-10-CM | POA: Diagnosis not present

## 2017-10-06 ENCOUNTER — Other Ambulatory Visit: Payer: Self-pay | Admitting: Gynecology

## 2017-10-06 DIAGNOSIS — Z13820 Encounter for screening for osteoporosis: Secondary | ICD-10-CM

## 2017-10-14 ENCOUNTER — Ambulatory Visit (INDEPENDENT_AMBULATORY_CARE_PROVIDER_SITE_OTHER): Payer: BLUE CROSS/BLUE SHIELD | Admitting: Licensed Clinical Social Worker

## 2017-10-14 DIAGNOSIS — F419 Anxiety disorder, unspecified: Secondary | ICD-10-CM

## 2017-10-16 ENCOUNTER — Ambulatory Visit (INDEPENDENT_AMBULATORY_CARE_PROVIDER_SITE_OTHER): Payer: BLUE CROSS/BLUE SHIELD

## 2017-10-16 ENCOUNTER — Other Ambulatory Visit: Payer: Self-pay | Admitting: Gynecology

## 2017-10-16 DIAGNOSIS — M8589 Other specified disorders of bone density and structure, multiple sites: Secondary | ICD-10-CM

## 2017-10-16 DIAGNOSIS — Z13820 Encounter for screening for osteoporosis: Secondary | ICD-10-CM

## 2017-10-17 ENCOUNTER — Encounter: Payer: Self-pay | Admitting: Gynecology

## 2017-10-23 DIAGNOSIS — F419 Anxiety disorder, unspecified: Secondary | ICD-10-CM | POA: Diagnosis not present

## 2017-10-28 ENCOUNTER — Ambulatory Visit: Payer: BLUE CROSS/BLUE SHIELD | Admitting: Licensed Clinical Social Worker

## 2017-11-11 ENCOUNTER — Ambulatory Visit: Payer: BLUE CROSS/BLUE SHIELD | Admitting: Licensed Clinical Social Worker

## 2017-12-08 DIAGNOSIS — R6882 Decreased libido: Secondary | ICD-10-CM | POA: Diagnosis not present

## 2017-12-08 DIAGNOSIS — R232 Flushing: Secondary | ICD-10-CM | POA: Diagnosis not present

## 2017-12-08 DIAGNOSIS — G479 Sleep disorder, unspecified: Secondary | ICD-10-CM | POA: Diagnosis not present

## 2017-12-09 DIAGNOSIS — N951 Menopausal and female climacteric states: Secondary | ICD-10-CM | POA: Diagnosis not present

## 2017-12-09 DIAGNOSIS — N898 Other specified noninflammatory disorders of vagina: Secondary | ICD-10-CM | POA: Diagnosis not present

## 2017-12-09 DIAGNOSIS — R232 Flushing: Secondary | ICD-10-CM | POA: Diagnosis not present

## 2017-12-09 DIAGNOSIS — G479 Sleep disorder, unspecified: Secondary | ICD-10-CM | POA: Diagnosis not present

## 2017-12-11 IMAGING — NM NM MISC PROCEDURE
3 series · 18 of 18 positions shown · non-contrast
Comparison: none

[Series 1: wbr_r-proj_st rest_(id)_sa · 6.5mm · 6.51mm/px · 6 of 64 frames shown]
[frame 6/64]
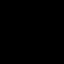
[frame 16/64]
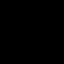
[frame 27/64]
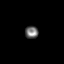
[frame 38/64]
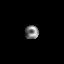
[frame 48/64]
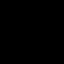
[frame 59/64]
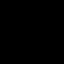

[Series 1: wbr_s-proj_st stress_(id)_sa · 6.5mm · 6.51mm/px · 6 of 64 frames shown (1 of 2)]
[frame 6/64]
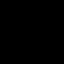
[frame 16/64]
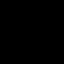
[frame 27/64]
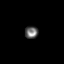
[frame 38/64]
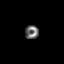
[frame 48/64]
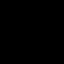
[frame 59/64]
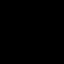

[Series 1: wbr_s-proj_st stress_(id)_sa · 6.5mm · 6.51mm/px · 6 of 512 frames shown (2 of 2)]
[frame 43/512]
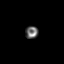
[frame 128/512]
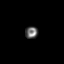
[frame 214/512]
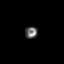
[frame 299/512]
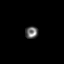
[frame 384/512]
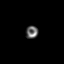
[frame 470/512]
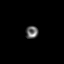

[18 of 18 positions shown; findings below may reference images not displayed]

Canned report from images found in remote index.

Refer to host system for actual result text.

## 2018-02-07 DIAGNOSIS — M5441 Lumbago with sciatica, right side: Secondary | ICD-10-CM | POA: Diagnosis not present

## 2018-02-07 DIAGNOSIS — M545 Low back pain: Secondary | ICD-10-CM | POA: Diagnosis not present

## 2018-02-14 DIAGNOSIS — M545 Low back pain: Secondary | ICD-10-CM | POA: Diagnosis not present

## 2018-02-17 DIAGNOSIS — M545 Low back pain: Secondary | ICD-10-CM | POA: Diagnosis not present

## 2018-02-17 DIAGNOSIS — M5441 Lumbago with sciatica, right side: Secondary | ICD-10-CM | POA: Diagnosis not present

## 2018-02-17 DIAGNOSIS — M5442 Lumbago with sciatica, left side: Secondary | ICD-10-CM | POA: Diagnosis not present

## 2018-02-23 DIAGNOSIS — M47816 Spondylosis without myelopathy or radiculopathy, lumbar region: Secondary | ICD-10-CM | POA: Diagnosis not present

## 2018-06-04 DIAGNOSIS — Z23 Encounter for immunization: Secondary | ICD-10-CM | POA: Diagnosis not present

## 2018-07-14 DIAGNOSIS — M9901 Segmental and somatic dysfunction of cervical region: Secondary | ICD-10-CM | POA: Diagnosis not present

## 2018-07-14 DIAGNOSIS — M545 Low back pain: Secondary | ICD-10-CM | POA: Diagnosis not present

## 2018-07-14 DIAGNOSIS — M542 Cervicalgia: Secondary | ICD-10-CM | POA: Diagnosis not present

## 2018-07-14 DIAGNOSIS — M9903 Segmental and somatic dysfunction of lumbar region: Secondary | ICD-10-CM | POA: Diagnosis not present

## 2018-07-15 DIAGNOSIS — M545 Low back pain: Secondary | ICD-10-CM | POA: Diagnosis not present

## 2018-07-15 DIAGNOSIS — M542 Cervicalgia: Secondary | ICD-10-CM | POA: Diagnosis not present

## 2018-07-15 DIAGNOSIS — M9901 Segmental and somatic dysfunction of cervical region: Secondary | ICD-10-CM | POA: Diagnosis not present

## 2018-07-15 DIAGNOSIS — M9903 Segmental and somatic dysfunction of lumbar region: Secondary | ICD-10-CM | POA: Diagnosis not present

## 2018-07-20 DIAGNOSIS — M9903 Segmental and somatic dysfunction of lumbar region: Secondary | ICD-10-CM | POA: Diagnosis not present

## 2018-07-20 DIAGNOSIS — M9901 Segmental and somatic dysfunction of cervical region: Secondary | ICD-10-CM | POA: Diagnosis not present

## 2018-07-20 DIAGNOSIS — M545 Low back pain: Secondary | ICD-10-CM | POA: Diagnosis not present

## 2018-07-20 DIAGNOSIS — Z23 Encounter for immunization: Secondary | ICD-10-CM | POA: Diagnosis not present

## 2018-07-20 DIAGNOSIS — M542 Cervicalgia: Secondary | ICD-10-CM | POA: Diagnosis not present

## 2018-07-21 DIAGNOSIS — M542 Cervicalgia: Secondary | ICD-10-CM | POA: Diagnosis not present

## 2018-07-21 DIAGNOSIS — M545 Low back pain: Secondary | ICD-10-CM | POA: Diagnosis not present

## 2018-07-21 DIAGNOSIS — M9901 Segmental and somatic dysfunction of cervical region: Secondary | ICD-10-CM | POA: Diagnosis not present

## 2018-07-21 DIAGNOSIS — M9903 Segmental and somatic dysfunction of lumbar region: Secondary | ICD-10-CM | POA: Diagnosis not present

## 2018-07-23 DIAGNOSIS — M9903 Segmental and somatic dysfunction of lumbar region: Secondary | ICD-10-CM | POA: Diagnosis not present

## 2018-07-23 DIAGNOSIS — M542 Cervicalgia: Secondary | ICD-10-CM | POA: Diagnosis not present

## 2018-07-23 DIAGNOSIS — M9901 Segmental and somatic dysfunction of cervical region: Secondary | ICD-10-CM | POA: Diagnosis not present

## 2018-07-23 DIAGNOSIS — M545 Low back pain: Secondary | ICD-10-CM | POA: Diagnosis not present

## 2018-07-27 DIAGNOSIS — M5416 Radiculopathy, lumbar region: Secondary | ICD-10-CM | POA: Diagnosis not present

## 2018-07-28 DIAGNOSIS — M5416 Radiculopathy, lumbar region: Secondary | ICD-10-CM | POA: Diagnosis not present

## 2018-08-03 ENCOUNTER — Encounter (INDEPENDENT_AMBULATORY_CARE_PROVIDER_SITE_OTHER): Payer: BLUE CROSS/BLUE SHIELD | Admitting: Neurology

## 2018-08-03 ENCOUNTER — Ambulatory Visit (INDEPENDENT_AMBULATORY_CARE_PROVIDER_SITE_OTHER): Payer: BLUE CROSS/BLUE SHIELD | Admitting: Neurology

## 2018-08-03 ENCOUNTER — Telehealth: Payer: Self-pay | Admitting: Neurology

## 2018-08-03 DIAGNOSIS — M21371 Foot drop, right foot: Secondary | ICD-10-CM | POA: Diagnosis not present

## 2018-08-03 DIAGNOSIS — Z0289 Encounter for other administrative examinations: Secondary | ICD-10-CM

## 2018-08-03 DIAGNOSIS — M542 Cervicalgia: Secondary | ICD-10-CM | POA: Diagnosis not present

## 2018-08-03 DIAGNOSIS — R269 Unspecified abnormalities of gait and mobility: Secondary | ICD-10-CM | POA: Insufficient documentation

## 2018-08-03 NOTE — Procedures (Signed)
Full Name: April Robertson Gender: Female MRN #: [id number] Date of Birth: Dec 01, 1955    Visit Date: 08/03/2018 09:09 Age: 62 Years 1 Months Old Examining Physician: Levert Feinstein, MD  Referring Physician: Jodi Geralds, MD History: 62 year old female with history of chronic low back pain, evidence of multilevel lumbar degenerative changes on MRI of lumbar spine, presented with worsening gait abnormality, right lower extremity weakness,  On examination: She has mild right ankle dorsiflexion/plantarflexion weakness, mild to moderate bilateral toe flexion extension weakness, right worse than left.  There was also evidence of mild bilateral shoulder abduction, external rotation, elbow flexion weakness, right worse than left.  Deep tendon reflexes were brisk and symmetric bilaterally, plantar responses were extensor bilaterally.  Summary of the tests:  Nerve conduction study: Bilateral sural, left superficial peroneal sensory responses were normal.  Right superficial peroneal sensory responses showed mildly decreased to snap amplitude.  Bilateral tibial motor responses showed decreased the C map amplitudes, right side is severe, moderately mildly prolonged distal latency and slowed conduction velocity on the right side.  Left peroneal to EDB motor responses were normal.  Right peroneal to EDB motor response showed mildly prolonged distal latency, with mildly decreased C map amplitude at proximal stimulation sites.  Electromyography: Selective needle examinations were performed at bilateral lower extremity muscles, lumbosacral paraspinal muscles, right upper extremity muscles and right cervical paraspinal muscles.  There was significant active neuropathic changes involving bilateral lower extremity muscles, right worse than left, mainly S1, L5 more than L4.  There was no significant abnormality on the needle examination of right upper extremity muscles and right cervical paraspinal muscles.    Conclusion: This is an abnormal study.  There is electrodiagnostic evidence of active bilateral lumbosacral radiculopathy, mainly involving bilateral S1, L5 more than L4, there is no evidence of right cervical radiculopathy.   ------------------------------- Levert Feinstein, M.D. Ph.D  [facility] [address] [place], [place] [address] Tel: [phone] Fax: [phone]        [facility]    Nerve / Sites Muscle Latency Ref. Amplitude Ref. Rel Amp Segments Distance Velocity Ref. Area    ms ms mV mV %  cm m/s m/s mVms  R Peroneal - EDB     Ankle EDB 6.7 ?6.5 2.2 ?2.0 100 Ankle - EDB 9   7.6     Fib head EDB 13.1  1.9  85.3 Fib head - Ankle 29 46 ?44 6.5     Pop fossa EDB 15.3  1.4  75.7 Pop fossa - Fib head 10 45 ?44 4.5         Pop fossa - Ankle      L Peroneal - EDB     Ankle EDB 3.7 ?6.5 5.4 ?2.0 100 Ankle - EDB 9   16.8     Fib head EDB 9.2  4.7  86.9 Fib head - Ankle 29 53 ?44 16.0     Pop fossa EDB 11.5  4.5  95.4 Pop fossa - Fib head 10 44 ?44 15.5         Pop fossa - Ankle      R Tibial - AH     Ankle AH 6.8 ?5.8 0.3 ?4.0 100 Ankle - AH 9   4.5     Pop fossa AH 16.8  0.2  92.9 Pop fossa - Ankle 33 33 ?41 3.0  L Tibial - AH     Ankle AH 4.0 ?5.8 1.5 ?4.0 100 Ankle - AH  9   4.3     Pop fossa AH 11.9  1.3  86.9 Pop fossa - Ankle 33 42 ?41 3.4             SNC    Nerve / Sites Rec. Site Peak Lat Ref.  Amp Ref. Segments Distance    ms ms V V  cm  R Sural - Ankle (Calf)     Calf Ankle 2.7 ?4.4 11 ?6 Calf - Ankle 14  L Sural - Ankle (Calf)     Calf Ankle 2.7 ?4.4 7 ?6 Calf - Ankle 14  R Superficial peroneal - Ankle     Lat leg Ankle 3.4 ?4.4 4 ?6 Lat leg - Ankle 14  L Superficial peroneal - Ankle     Lat leg Ankle 3.4 ?4.4 6 ?6 Lat leg - Ankle 14              F  Wave    Nerve F Lat Ref.   ms ms  R Tibial - AH 69.4 ?56.0  L Tibial - AH 47.9 ?56.0         H Reflex    Nerve H Lat   ms   Left Right Ref.  Tibial - Soleus 33.9 40.0 ?35.0         EMG  full       EMG Summary Table    Spontaneous MUAP Recruitment  Muscle IA Fib PSW Fasc Other Amp Dur. Poly Pattern  R. Tibialis anterior Increased None None None _______ Increased Increased Normal Reduced  R. Tibialis posterior Increased 1+ None None _______ Increased Increased Normal Reduced  R. Peroneus longus Increased None None None _______ Increased Increased Normal Reduced  R. Gastrocnemius (Medial head) Increased 1+ 1+ None _______ Increased Increased 1+ Reduced  R. Vastus lateralis Normal None None None _______ Increased Increased Normal Reduced  R. Biceps femoris (short head) Increased None None None _______ Increased Increased Normal Reduced  R. Gluteus medius Increased None None None _______ Increased Increased Normal Reduced  R. Lumbar paraspinals (mid) Normal None None None _______ Normal Normal Normal Normal  R. Lumbar paraspinals (low) Normal None None None _______ Normal Normal Normal Normal  L. Tibialis anterior Increased None None None _______ Normal Normal Normal Reduced  L. Tibialis posterior Normal None None None _______ Normal Normal Normal Reduced  L. Peroneus longus Normal None None None _______ Normal Normal Normal Normal  L. Vastus lateralis Normal None None None _______ Normal Normal Normal Normal  L. Lumbar paraspinals (low) Normal None None None _______ Normal Normal Normal Normal  R. Pronator teres Normal None None None _______ Normal Normal Normal Normal  R. Biceps brachii Normal None None None _______ Normal Normal Normal Normal  R. Deltoid Normal None None None _______ Normal Normal Normal Normal  R. Cervical paraspinals Normal None None None _______ Normal Normal Normal Normal

## 2018-08-03 NOTE — Telephone Encounter (Signed)
She meet her deductible, need to have MRI cervical before end of year.

## 2018-08-04 ENCOUNTER — Telehealth: Payer: Self-pay | Admitting: Neurology

## 2018-08-04 NOTE — Telephone Encounter (Signed)
Give her an appointment as new patient with me

## 2018-08-04 NOTE — Telephone Encounter (Signed)
[employer] Auth: [id number] (exp. 08/04/18 to 09/02/18) lvm for pt to call back about scheduling mri

## 2018-08-04 NOTE — Telephone Encounter (Signed)
Spoke to the patient she [facility]

## 2018-08-05 ENCOUNTER — Ambulatory Visit: Payer: BLUE CROSS/BLUE SHIELD

## 2018-08-05 DIAGNOSIS — R269 Unspecified abnormalities of gait and mobility: Secondary | ICD-10-CM

## 2018-08-05 DIAGNOSIS — M542 Cervicalgia: Secondary | ICD-10-CM | POA: Diagnosis not present

## 2018-08-05 DIAGNOSIS — M21371 Foot drop, right foot: Secondary | ICD-10-CM | POA: Diagnosis not present

## 2018-08-07 ENCOUNTER — Telehealth: Payer: Self-pay | Admitting: Neurology

## 2018-08-07 NOTE — Telephone Encounter (Signed)
I checked, results not available yet. We will call once available.

## 2018-08-07 NOTE — Telephone Encounter (Signed)
Pt has called for the results of her MRI, she is asking for a call just as soon as the results are available

## 2018-08-10 ENCOUNTER — Telehealth: Payer: Self-pay | Admitting: Neurology

## 2018-08-10 NOTE — Telephone Encounter (Signed)
Spoke to patient - she is aware of results and will further discuss with Dr. Terrace ArabiaYan at her pending follow up.

## 2018-08-10 NOTE — Telephone Encounter (Signed)
MRI of the cervical spine showed multilevel mild degenerative changes, there is no canal stenosis, variable degree of foraminal narrowing, will review MRI films with her at next follow-up visit.   IMPRESSION: This MRI of the cervical spine without contrast shows the following: 1.    The spinal cord appears normal. 2.    Multilevel degenerative changes as detailed above with minimal anterolisthesis at C3-C4, C4-C5 and C7-T1 associated with facet hypertrophy and other degenerative changes.   At C4-C5 there is potential for compression of the left C5 nerve root. 3.    Mild spinal stenosis at C5-C6.

## 2018-08-17 DIAGNOSIS — M545 Low back pain: Secondary | ICD-10-CM | POA: Diagnosis not present

## 2018-08-27 DIAGNOSIS — M5441 Lumbago with sciatica, right side: Secondary | ICD-10-CM | POA: Diagnosis not present

## 2018-08-27 DIAGNOSIS — M4722 Other spondylosis with radiculopathy, cervical region: Secondary | ICD-10-CM | POA: Diagnosis not present

## 2018-08-27 DIAGNOSIS — M25551 Pain in right hip: Secondary | ICD-10-CM | POA: Diagnosis not present

## 2018-09-02 ENCOUNTER — Ambulatory Visit: Payer: Self-pay | Admitting: Neurology

## 2018-09-07 DIAGNOSIS — M5416 Radiculopathy, lumbar region: Secondary | ICD-10-CM | POA: Diagnosis not present

## 2018-09-07 DIAGNOSIS — M4316 Spondylolisthesis, lumbar region: Secondary | ICD-10-CM | POA: Diagnosis not present

## 2018-09-07 DIAGNOSIS — M545 Low back pain: Secondary | ICD-10-CM | POA: Diagnosis not present

## 2018-09-07 DIAGNOSIS — M5126 Other intervertebral disc displacement, lumbar region: Secondary | ICD-10-CM | POA: Diagnosis not present

## 2018-09-16 DIAGNOSIS — M5418 Radiculopathy, sacral and sacrococcygeal region: Secondary | ICD-10-CM | POA: Diagnosis not present

## 2018-09-22 DIAGNOSIS — M5416 Radiculopathy, lumbar region: Secondary | ICD-10-CM | POA: Diagnosis not present

## 2018-09-22 DIAGNOSIS — M545 Low back pain: Secondary | ICD-10-CM | POA: Diagnosis not present

## 2018-09-23 ENCOUNTER — Other Ambulatory Visit: Payer: Self-pay | Admitting: Internal Medicine

## 2018-09-23 DIAGNOSIS — Z1231 Encounter for screening mammogram for malignant neoplasm of breast: Secondary | ICD-10-CM

## 2018-09-30 ENCOUNTER — Institutional Professional Consult (permissible substitution): Payer: BLUE CROSS/BLUE SHIELD | Admitting: Neurology

## 2018-10-06 DIAGNOSIS — M5416 Radiculopathy, lumbar region: Secondary | ICD-10-CM | POA: Diagnosis not present

## 2018-10-20 ENCOUNTER — Other Ambulatory Visit: Payer: Self-pay | Admitting: Orthopedic Surgery

## 2018-10-22 NOTE — Pre-Procedure Instructions (Signed)
April Robertson  10/22/2018      [id number] [place], [place] - [address] AT [facility] [address] & [facility] [address] [place] [address] Phone: [phone] Fax: [phone]  [facility] #[id number] - [place], [place] - [address] AT [facility] OF [facility] [address] [place] [address] Phone: [phone] Fax: [phone]    Your procedure is scheduled on March 12th.  Report to [facility] Entrance "A" Admitting at 5:30 A.M.  Call this number if you have problems the morning of surgery:  [phone]   Remember:  Do not eat or drink after midnight.     Take these medicines the morning of surgery with A SIP OF WATER   Alprazolam (Xanax)  Citalopram (Celexa)  Metroprolol (Lopressor)  Sertraline (Zoloft)  Naltrexone (Depade)  7 days prior to surgery STOP taking any Aspirin (unless otherwise instructed by your surgeon), Aleve, Naproxen, Ibuprofen, Motrin, Advil, Goody's, BC's, all herbal medications, fish oil, and all vitamins.     Do not wear jewelry, make-up or nail polish.  Do not wear lotions, powders, or perfumes, or deodorant.  Do not shave 48 hours prior to surgery.    Do not bring valuables to the hospital.  [facility] is not responsible for any belongings or valuables.   [facility]- Preparing For Surgery  Before surgery, you can play an important role. Because skin is not sterile, your skin needs to be as free of germs as possible. You can reduce the number of germs on your skin by washing with CHG (chlorahexidine gluconate) Soap before surgery.  CHG is an antiseptic cleaner which kills germs and bonds with the skin to continue killing germs even after washing.    Oral Hygiene is also important to reduce your risk of infection.  Remember - BRUSH YOUR TEETH THE MORNING OF SURGERY WITH YOUR REGULAR TOOTHPASTE  Please do not use if you have an allergy to CHG or antibacterial soaps. If your skin becomes  reddened/irritated stop using the CHG.  Do not shave (including legs and underarms) for at least 48 hours prior to first CHG shower. It is OK to shave your face.  Please follow these instructions carefully.   1. Shower the NIGHT BEFORE SURGERY and the MORNING OF SURGERY with CHG.   2. If you chose to wash your hair, wash your hair first as usual with your normal shampoo.  3. After you shampoo, rinse your hair and body thoroughly to remove the shampoo.  4. Use CHG as you would any other liquid soap. You can apply CHG directly to the skin and wash gently with a scrungie or a clean washcloth.   5. Apply the CHG Soap to your body ONLY FROM THE NECK DOWN.  Do not use on open wounds or open sores. Avoid contact with your eyes, ears, mouth and genitals (private parts). Wash Face and genitals (private parts)  with your normal soap.  6. Wash thoroughly, paying special attention to the area where your surgery will be performed.  7. Thoroughly rinse your body with warm water from the neck down.  8. DO NOT shower/wash with your normal soap after using and rinsing off the CHG Soap.  9. Pat yourself dry with a CLEAN TOWEL.  10. Wear CLEAN PAJAMAS to bed the night before surgery, wear comfortable clothes the morning of surgery  11. Place CLEAN SHEETS on your bed the  night of your first shower and DO NOT SLEEP WITH PETS.   Day of Surgery:  Do not apply any deodorants/lotions.  Please wear clean clothes to the hospital/surgery center.   Remember to brush your teeth WITH YOUR REGULAR TOOTHPASTE.   Contacts, dentures or bridgework may not be worn into surgery.  Leave your suitcase in the car.  After surgery it may be brought to your room.  For patients admitted to the hospital, discharge time will be determined by your treatment team.  Patients discharged the day of surgery will not be allowed to drive home.   Please read over the following fact sheets that you were given. Coughing and Deep  Breathing and Surgical Site Infection Prevention

## 2018-10-23 ENCOUNTER — Encounter (HOSPITAL_COMMUNITY)
Admission: RE | Admit: 2018-10-23 | Discharge: 2018-10-23 | Disposition: A | Discharge status: Home / Self Care | Payer: BLUE CROSS/BLUE SHIELD | Source: Ambulatory Visit | Attending: Orthopedic Surgery | Admitting: Orthopedic Surgery

## 2018-10-23 ENCOUNTER — Other Ambulatory Visit: Payer: Self-pay

## 2018-10-23 ENCOUNTER — Encounter (HOSPITAL_COMMUNITY): Payer: Self-pay

## 2018-10-23 DIAGNOSIS — R001 Bradycardia, unspecified: Secondary | ICD-10-CM | POA: Diagnosis not present

## 2018-10-23 DIAGNOSIS — I10 Essential (primary) hypertension: Secondary | ICD-10-CM | POA: Diagnosis not present

## 2018-10-23 DIAGNOSIS — Z01818 Encounter for other preprocedural examination: Secondary | ICD-10-CM | POA: Diagnosis not present

## 2018-10-23 DIAGNOSIS — M5416 Radiculopathy, lumbar region: Secondary | ICD-10-CM | POA: Diagnosis not present

## 2018-10-23 LAB — COMPREHENSIVE METABOLIC PANEL
ALT: 19 U/L (ref 0–44)
AST: 26 U/L (ref 15–41)
Albumin: 3.9 g/dL (ref 3.5–5.0)
Alkaline Phosphatase: 81 U/L (ref 38–126)
Anion gap: 7 (ref 5–15)
BUN: 8 mg/dL (ref 8–23)
CO2: 26 mmol/L (ref 22–32)
Calcium: 9.4 mg/dL (ref 8.9–10.3)
Chloride: 104 mmol/L (ref 98–111)
Creatinine, Ser: 0.72 mg/dL (ref 0.44–1.00)
GFR calc Af Amer: >60 mL/min (ref >60)
GFR calc non Af Amer: >60 mL/min (ref >60)
Glucose, Bld: 111 mg/dL — ABNORMAL HIGH (ref 70–99)
Potassium: 3.7 mmol/L (ref 3.5–5.1)
Sodium: 137 mmol/L (ref 135–145)
Total Bilirubin: 0.5 mg/dL (ref 0.3–1.2)
Total Protein: 7.2 g/dL (ref 6.5–8.1)

## 2018-10-23 LAB — CBC WITH DIFFERENTIAL/PLATELET
Abs Immature Granulocytes: 0.02 10*3/uL (ref 0.00–0.07)
Basophils Absolute: 0.1 10*3/uL (ref 0.0–0.1)
Basophils Relative: 1 %
EOS ABS: 0 10*3/uL (ref 0.0–0.5)
Eosinophils Relative: 1 %
HCT: 39.9 % (ref 36.0–46.0)
Hemoglobin: 12.7 g/dL (ref 12.0–15.0)
Immature Granulocytes: 0 %
Lymphocytes Relative: 37 %
Lymphs Abs: 1.8 10*3/uL (ref 0.7–4.0)
MCH: 29.6 pg (ref 26.0–34.0)
MCHC: 31.8 g/dL (ref 30.0–36.0)
MCV: 93 fL (ref 80.0–100.0)
MONOS PCT: 11 %
Monocytes Absolute: 0.5 10*3/uL (ref 0.1–1.0)
Neutro Abs: 2.5 10*3/uL (ref 1.7–7.7)
Neutrophils Relative %: 50 %
Platelets: 205 10*3/uL (ref 150–400)
RBC: 4.29 MIL/uL (ref 3.87–5.11)
RDW: 12.8 % (ref 11.5–15.5)
WBC: 4.9 10*3/uL (ref 4.0–10.5)
nRBC: 0 % (ref 0.0–0.2)

## 2018-10-23 LAB — PROTIME-INR
INR: 1 (ref 0.8–1.2)
PROTHROMBIN TIME: 12.7 s (ref 11.4–15.2)

## 2018-10-23 LAB — URINALYSIS, ROUTINE W REFLEX MICROSCOPIC
BILIRUBIN URINE: NEGATIVE
Glucose, UA: NEGATIVE mg/dL
Hgb urine dipstick: NEGATIVE
Ketones, ur: NEGATIVE mg/dL
Leukocytes,Ua: NEGATIVE
NITRITE: NEGATIVE
Protein, ur: NEGATIVE mg/dL
Specific Gravity, Urine: 1.008 (ref 1.005–1.030)
pH: 5 (ref 5.0–8.0)

## 2018-10-23 LAB — SURGICAL PCR SCREEN
MRSA, PCR: NEGATIVE
STAPHYLOCOCCUS AUREUS: NEGATIVE

## 2018-10-23 LAB — APTT: aPTT: 37 seconds — ABNORMAL HIGH (ref 24–36)

## 2018-10-23 NOTE — Progress Notes (Signed)
PCP - Kirby Funk Cardiologist - denies  Chest x-ray - N/A EKG - 10-23-18  DM - denies SA - denies  Anesthesia review: yes, heart history  Patient denies shortness of breath, fever, cough and chest pain at PAT appointment   Patient verbalized understanding of instructions that were given to them at the PAT appointment. Patient was also instructed that they will need to review over the PAT instructions again at home before surgery.

## 2018-10-26 NOTE — Progress Notes (Signed)
Anesthesia Chart Review:  Case:  [id number] Date/Time:  10/29/18 0715   Procedure:  RIGHT - SIDED LUMBAR 4 - LUMBAR 5, LUMBAR 5 - SACRUM 1 TRANSFORAMINAL LUMBAR INTERBODY FUSION WITH INSTRUMENTATION AND ALLOGRAFT (Right )   Anesthesia type:  General   Pre-op diagnosis:  SEVERE  STENOSIS AT LUMBAR 4-LUMBAR 5 WITH NEUROFORAMINAL STENOSIS ON THE RIGHT LUMBAR 5 - SACRUM 1   Location:  [facility] ROOM 05 / [facility]   Surgeon:  Estill Bamberg, MD      DISCUSSION: Patient is a 63 year old female scheduled for the above procedure.   History includes never smoker, tachycardia/SVT, MVP/MR (s/p complex MV repair using 38 mm Future ring annuloplasty, 2 artifical cord repairs using Gortex to P2 09/30/08, Judd Gaudier, MD, [facility]). Normal coronaries in 2010 prior to MV repair.   She last saw cardiology in May 2017. (I don't see specific follow-up instructions outlined. Patient reported that she was under the impression that it was as needed follow-up.) An event monitor was ordered at that time because of SVT requiring IV lopressor during non-ischemic stress test. Echo showed normal LVF, stable MV repair with mild MR, no increased PA pressures. The Holter monitor was normal. Patient is on b-blocker therapy. I called and spoke with patient. She reports that she has been doing well from a cardiac standpoint. She denied chest pain, SOB, palpitations, orthopnea, swelling, dizziness/syncope. She reports she was not aware of the SVT until episode during the stress test. No similar events in "a long time." Reports that she had been walking 2-4 miles up to 04/2018 when she had to stop due to becoming more prone to falls which lead to her back work-up and need for surgery. She was not having cardiopulmonary symptoms when walking this distance.  Based on conversation with patient, she does not describe any symptoms concerning for worsening valvular disease or prolonged symptomatic arrhythmias. I did recommend that she either contact  cardiology or her PCP Dr. Valentina Lucks to discuss long term recommendations for cardiology follow-up or if any serial echocardiograms needed given history of MV repair. Last echo within the past three years showed stable mitral valve. Discussed with anesthesiologist Marcene Duos, MD. If no acute changes then it is anticipated that she can proceed as planned.   VS: BP 109/73   Pulse 67   Temp (!) 36.4 C   Resp 18   Ht 5\' 3"  (1.6 m)   Wt 60.5 kg   LMP 06/25/2012   SpO2 95%   BMI 23.61 kg/m   PROVIDERS: Kirby Funk, MD is PCP Verdis Prime, MD is cardiologist. Last visit 12/25/15 with Norma Fredrickson, NP. For follow-up stress and echo reports that were done for evaluation of DOE. Patient had SVT during stress test, but was non-ischemic. Echo showed normal LVF, stable MV repair with mild MR, no increased PA pressures. A 24 hour Holter was ordered to help determine whether to proceed with medical management versus refer for ablation. Holter monitor was normal. Sleep study was recommended following spouse's observation of patient snoring with "periods where she stops breathing." Patient ultimately declined sleep study.     LABS: Labs reviewed: Acceptable for surgery. (all labs ordered are listed, but only abnormal results are displayed)  Labs Reviewed  APTT - Abnormal; Notable for the following components:      Result Value   aPTT 37 (*)    All other components within normal limits  COMPREHENSIVE METABOLIC PANEL - Abnormal; Notable for the following components:  Glucose, Bld 111 (*)    All other components within normal limits  SURGICAL PCR SCREEN  CBC WITH DIFFERENTIAL/PLATELET  PROTIME-INR  URINALYSIS, ROUTINE W REFLEX MICROSCOPIC    IMAGES: MRI C-spine 08/05/18: IMPRESSION:  1.    The spinal cord appears normal. 2.    Multilevel degenerative changes as detailed above with minimal anterolisthesis at C3-C4, C4-C5 and C7-T1 associated with facet hypertrophy and other degenerative  changes.   At C4-C5 there is potential for compression of the left C5 nerve root. 3.    Mild spinal stenosis at C5-C6.   EKG: 10/23/18: Sinus bradycardia with 1st degree A-V block Otherwise normal ECG Since last tracing rate slower Confirmed by Lance Muss [phone]) on 10/23/2018 4:53:39 PM   CV: 24 Hour Holter Monitor 01/04/16:  NSR with average HR 73 bpm and range 55 - 119 bpm Normal Study  Nuclear stress test 12/20/15:  Nuclear stress EF: 67%.  The study is normal.  This is a low risk study.  The left ventricular ejection fraction is hyperdynamic (>65%). Mild breast attenuation no ischemia or infarction EF 67% Patient had prolonged SVT during test Rx with iv lopressor   Echo 12/20/15: Study Conclusions - Left ventricle: The cavity size was normal. There was moderate   focal basal hypertrophy of the septum. Systolic function was   normal. The estimated ejection fraction was in the range of 55%   to 60%. Wall motion was normal; there were no regional wall   motion abnormalities. Doppler parameters are consistent with   abnormal left ventricular relaxation (grade 1 diastolic   dysfunction). - Mitral valve: s/p annuloplasty repair. No stenosis. There was   mild regurgitation. - Left atrium: The atrium was mildly dilated. - Tricuspid valve: There was trivial regurgitation. - Pulmonary arteries: PA peak pressure: 24 mm Hg (S). - Systemic veins: The IVC measures >2.1 cm, but collapses >50%,   suggesting an elevated RA pressure of 8 mmHg. - Pericardium, extracardiac: There was no pericardial effusion. Impressions: - LVEF 55-60%, moderate focal basal septal hypertrophy, normal wall   motion, diastolic dysfunction, mitral annuloplasty repair with   mild MR, mild LAE, trivial TR, normal RVSP, elevated RA pressure   of 8 mmHg.  Cardiac cath (PRE-MV Repair) 09/09/08 Amil Amen, Jonny Ruiz, MD; scanned under Media tab, Correspondence, Encounter 11/23/12):  IMPRESSION: 1. Normal right  heart pressures.  2. Normal cardiac outputs.  3. Severe mitral regurgitation.  4. Hyperdynamic LV systolic function. EF 75-80%.  5. Normal coronary arteries. Plan: The patient is scheduled for mitral valve repair on 09/30/08.   Past Medical History:  Diagnosis Date  . Anxiety   . Heart valve replaced   . Hypertension   . Insomnia   . Mitral valve prolapse    history of mitral valve prolapse and mitral valve repair 2010. 2-D echo 2011 with LVEF greater than 70% and mild mitral regurgitation  . Mitral valve regurgitation   . Osteoarthritis   . Osteopenia 09/2017   T score -1.8 FRAX 8.2% / 0.8%  . SVT (supraventricular tachycardia) (HCC)   . Tachycardia     Past Surgical History:  Procedure Laterality Date  . AUGMENTATION MAMMAPLASTY Bilateral   . BREAST SURGERY     bilateral breast implants  . KNEE ARTHROPLASTY Right 11/23/2012   Procedure: COMPUTER ASSISTED TOTAL KNEE ARTHROPLASTY;  Surgeon: Harvie Junior, MD;  Location: [facility] OR;  Service: Orthopedics;  Laterality: Right;  GENERAL WITH PRE OP FEMORALL NERVE BLOCK  . MITRAL VALVE REPAIR  2010  minimally invasive    MEDICATIONS: . ALPRAZolam (XANAX) 0.5 MG tablet  . metoprolol tartrate (LOPRESSOR) 50 MG tablet  . Metoprolol Tartrate 75 MG TABS  . naltrexone (DEPADE) 50 MG tablet  . OVER THE COUNTER MEDICATION  . sertraline (ZOLOFT) 100 MG tablet  . traZODone (DESYREL) 50 MG tablet   No current facility-administered medications for this encounter.    . metoprolol (LOPRESSOR) injection 2.5 mg    Shonna Chock, PA-C Surgical Short Stay/Anesthesiology [facility] Phone [phone] [facility] Phone [phone] 10/26/2018 3:11 PM

## 2018-10-27 ENCOUNTER — Ambulatory Visit
Admission: RE | Admit: 2018-10-27 | Discharge: 2018-10-27 | Disposition: A | Discharge status: Home / Self Care | Payer: BLUE CROSS/BLUE SHIELD | Source: Ambulatory Visit | Attending: Internal Medicine | Admitting: Internal Medicine

## 2018-10-27 DIAGNOSIS — Z1231 Encounter for screening mammogram for malignant neoplasm of breast: Secondary | ICD-10-CM | POA: Diagnosis not present

## 2018-10-28 NOTE — Anesthesia Preprocedure Evaluation (Addendum)
Anesthesia Evaluation   Patient awake    Reviewed: Allergy & Precautions, NPO status , Patient's Chart, lab work & pertinent test results, reviewed documented beta blocker date and time   Airway Mallampati: II  TM Distance: <3 FB Neck ROM: Full    Dental no notable dental hx. (+) Teeth Intact, Dental Advisory Given   Pulmonary neg pulmonary ROS,    Pulmonary exam normal breath sounds clear to auscultation       Cardiovascular Exercise Tolerance: Good hypertension, Pt. on medications and Pt. on home beta blockers Normal cardiovascular exam+ dysrhythmias Supra Ventricular Tachycardia + Valvular Problems/Murmurs  Rhythm:Regular Rate:Normal  10/23/2018  SB w 1 AV block   Neuro/Psych PSYCHIATRIC DISORDERS Anxiety negative neurological ROS     GI/Hepatic negative GI ROS, Neg liver ROS,   Endo/Other  negative endocrine ROS  Renal/GU negative Renal ROS     Musculoskeletal  (+) Arthritis ,   Abdominal   Peds  Hematology   Anesthesia Other Findings   Reproductive/Obstetrics                           Anesthesia Physical Anesthesia Plan  ASA: II  Anesthesia Plan: General   Post-op Pain Management:    Induction: Intravenous  PONV Risk Score and Plan: 4 or greater and Treatment may vary due to age or medical condition, Ondansetron, Dexamethasone and Scopolamine patch - Pre-op  Airway Management Planned: Oral ETT  Additional Equipment: None  Intra-op Plan:   Post-operative Plan: Extubation in OR  Informed Consent: I have reviewed the patients History and Physical, chart, labs and discussed the procedure including the risks, benefits and alternatives for the proposed anesthesia with the patient or authorized representative who has indicated his/her understanding and acceptance.     Dental advisory given  Plan Discussed with: CRNA, Anesthesiologist and Surgeon  Anesthesia Plan Comments:         Anesthesia Quick Evaluation

## 2018-10-29 ENCOUNTER — Encounter (HOSPITAL_COMMUNITY)
Admission: RE | Disposition: A | Discharge status: Home / Self Care | Payer: Self-pay | Source: Home / Self Care | Attending: Orthopedic Surgery

## 2018-10-29 ENCOUNTER — Inpatient Hospital Stay (HOSPITAL_COMMUNITY): Payer: BLUE CROSS/BLUE SHIELD

## 2018-10-29 ENCOUNTER — Inpatient Hospital Stay (HOSPITAL_COMMUNITY)
Admission: RE | Admit: 2018-10-29 | Discharge: 2018-10-30 | DRG: 455 | Disposition: A | Discharge status: Home / Self Care | Payer: BLUE CROSS/BLUE SHIELD | Attending: Orthopedic Surgery | Admitting: Orthopedic Surgery

## 2018-10-29 ENCOUNTER — Other Ambulatory Visit: Payer: Self-pay

## 2018-10-29 ENCOUNTER — Inpatient Hospital Stay (HOSPITAL_COMMUNITY): Payer: BLUE CROSS/BLUE SHIELD | Admitting: Anesthesiology

## 2018-10-29 ENCOUNTER — Inpatient Hospital Stay (HOSPITAL_COMMUNITY): Payer: BLUE CROSS/BLUE SHIELD | Admitting: Vascular Surgery

## 2018-10-29 ENCOUNTER — Encounter (HOSPITAL_COMMUNITY): Payer: Self-pay | Admitting: Certified Registered"

## 2018-10-29 DIAGNOSIS — G4700 Insomnia, unspecified: Secondary | ICD-10-CM | POA: Diagnosis present

## 2018-10-29 DIAGNOSIS — I10 Essential (primary) hypertension: Secondary | ICD-10-CM | POA: Diagnosis present

## 2018-10-29 DIAGNOSIS — M5416 Radiculopathy, lumbar region: Secondary | ICD-10-CM | POA: Diagnosis not present

## 2018-10-29 DIAGNOSIS — Z9882 Breast implant status: Secondary | ICD-10-CM | POA: Diagnosis not present

## 2018-10-29 DIAGNOSIS — E882 Lipomatosis, not elsewhere classified: Secondary | ICD-10-CM | POA: Diagnosis not present

## 2018-10-29 DIAGNOSIS — M4800 Spinal stenosis, site unspecified: Secondary | ICD-10-CM | POA: Diagnosis present

## 2018-10-29 DIAGNOSIS — Z419 Encounter for procedure for purposes other than remedying health state, unspecified: Secondary | ICD-10-CM

## 2018-10-29 DIAGNOSIS — R2689 Other abnormalities of gait and mobility: Secondary | ICD-10-CM | POA: Diagnosis not present

## 2018-10-29 DIAGNOSIS — M5116 Intervertebral disc disorders with radiculopathy, lumbar region: Principal | ICD-10-CM | POA: Diagnosis present

## 2018-10-29 DIAGNOSIS — R252 Cramp and spasm: Secondary | ICD-10-CM | POA: Diagnosis not present

## 2018-10-29 DIAGNOSIS — Z96651 Presence of right artificial knee joint: Secondary | ICD-10-CM | POA: Diagnosis present

## 2018-10-29 DIAGNOSIS — F419 Anxiety disorder, unspecified: Secondary | ICD-10-CM | POA: Diagnosis not present

## 2018-10-29 DIAGNOSIS — M5136 Other intervertebral disc degeneration, lumbar region: Secondary | ICD-10-CM | POA: Diagnosis not present

## 2018-10-29 DIAGNOSIS — Z79899 Other long term (current) drug therapy: Secondary | ICD-10-CM | POA: Diagnosis not present

## 2018-10-29 DIAGNOSIS — M48061 Spinal stenosis, lumbar region without neurogenic claudication: Secondary | ICD-10-CM | POA: Diagnosis not present

## 2018-10-29 DIAGNOSIS — M4326 Fusion of spine, lumbar region: Secondary | ICD-10-CM | POA: Diagnosis not present

## 2018-10-29 DIAGNOSIS — M1990 Unspecified osteoarthritis, unspecified site: Secondary | ICD-10-CM | POA: Diagnosis not present

## 2018-10-29 HISTORY — PX: TRANSFORAMINAL LUMBAR INTERBODY FUSION (TLIF) WITH PEDICLE SCREW FIXATION 2 LEVEL: SHX6142

## 2018-10-29 LAB — TYPE AND SCREEN
ABO/RH(D): O POS
Antibody Screen: NEGATIVE

## 2018-10-29 SURGERY — TRANSFORAMINAL LUMBAR INTERBODY FUSION (TLIF) WITH PEDICLE SCREW FIXATION 2 LEVEL
Anesthesia: General | Site: Spine Lumbar | Laterality: Right

## 2018-10-29 MED ORDER — ONDANSETRON HCL 4 MG PO TABS: 4.0000 mg | ORAL_TABLET | Freq: Four times a day (QID) | ORAL | Status: DC | PRN

## 2018-10-29 MED ORDER — DIAZEPAM 5 MG PO TABS
5.0000 mg | ORAL_TABLET | Freq: Four times a day (QID) | ORAL | Status: DC | PRN
  Administered 2018-10-29: 5 mg via ORAL
  Filled 2018-10-29: qty 1

## 2018-10-29 MED ORDER — ALUM & MAG HYDROXIDE-SIMETH 200-200-20 MG/5ML PO SUSP: 30.0000 mL | Freq: Four times a day (QID) | ORAL | Status: DC | PRN

## 2018-10-29 MED ORDER — PHENOL 1.4 % MT LIQD: 1.0000 | OROMUCOSAL | Status: DC | PRN

## 2018-10-29 MED ORDER — MORPHINE SULFATE (PF) 2 MG/ML IV SOLN: 1.0000 mg | INTRAVENOUS | Status: DC | PRN

## 2018-10-29 MED ORDER — BUPIVACAINE-EPINEPHRINE 0.25% -1:200000 IJ SOLN
INTRAMUSCULAR | Status: DC | PRN
  Administered 2018-10-29: 9 mL

## 2018-10-29 MED ORDER — ONDANSETRON HCL 4 MG/2ML IJ SOLN
INTRAMUSCULAR | Status: DC | PRN
  Administered 2018-10-29: 4 mg via INTRAVENOUS

## 2018-10-29 MED ORDER — METHYLENE BLUE 0.5 % INJ SOLN
INTRAVENOUS | Status: AC
  Filled 2018-10-29: qty 10

## 2018-10-29 MED ORDER — HYDROMORPHONE HCL 1 MG/ML IJ SOLN
INTRAMUSCULAR | Status: AC
  Filled 2018-10-29: qty 1

## 2018-10-29 MED ORDER — BUPIVACAINE-EPINEPHRINE (PF) 0.25% -1:200000 IJ SOLN
INTRAMUSCULAR | Status: AC
  Filled 2018-10-29: qty 30

## 2018-10-29 MED ORDER — BISACODYL 5 MG PO TBEC: 5.0000 mg | DELAYED_RELEASE_TABLET | Freq: Every day | ORAL | Status: DC | PRN

## 2018-10-29 MED ORDER — CEFAZOLIN SODIUM-DEXTROSE 2-4 GM/100ML-% IV SOLN
INTRAVENOUS | Status: AC
  Filled 2018-10-29: qty 100

## 2018-10-29 MED ORDER — 0.9 % SODIUM CHLORIDE (POUR BTL) OPTIME
TOPICAL | Status: DC | PRN
  Administered 2018-10-29: 3000 mL

## 2018-10-29 MED ORDER — EPHEDRINE SULFATE-NACL 50-0.9 MG/10ML-% IV SOSY
PREFILLED_SYRINGE | INTRAVENOUS | Status: DC | PRN
  Administered 2018-10-29 (×2): 10 mg via INTRAVENOUS

## 2018-10-29 MED ORDER — ACETAMINOPHEN 500 MG PO TABS
ORAL_TABLET | ORAL | Status: AC
  Filled 2018-10-29: qty 1

## 2018-10-29 MED ORDER — ROCURONIUM BROMIDE 50 MG/5ML IV SOSY
PREFILLED_SYRINGE | INTRAVENOUS | Status: AC
  Filled 2018-10-29: qty 5

## 2018-10-29 MED ORDER — METOPROLOL TARTRATE 25 MG PO TABS
75.0000 mg | ORAL_TABLET | Freq: Two times a day (BID) | ORAL | Status: DC
  Administered 2018-10-29: 75 mg via ORAL
  Filled 2018-10-29: qty 3

## 2018-10-29 MED ORDER — ONDANSETRON HCL 4 MG/2ML IJ SOLN
INTRAMUSCULAR | Status: AC
  Filled 2018-10-29: qty 2

## 2018-10-29 MED ORDER — CEFAZOLIN SODIUM-DEXTROSE 2-4 GM/100ML-% IV SOLN
2.0000 g | INTRAVENOUS | Status: AC
  Administered 2018-10-29 (×2): 2 g via INTRAVENOUS

## 2018-10-29 MED ORDER — MEPERIDINE HCL 50 MG/ML IJ SOLN: 6.2500 mg | INTRAMUSCULAR | Status: DC | PRN

## 2018-10-29 MED ORDER — SUFENTANIL CITRATE 50 MCG/ML IV SOLN
INTRAVENOUS | Status: DC | PRN
  Administered 2018-10-29: 10 ug via INTRAVENOUS
  Administered 2018-10-29: 15 ug via INTRAVENOUS
  Administered 2018-10-29: 10 ug via INTRAVENOUS
  Administered 2018-10-29: 5 ug via INTRAVENOUS

## 2018-10-29 MED ORDER — PROPOFOL 10 MG/ML IV BOLUS
INTRAVENOUS | Status: DC | PRN
  Administered 2018-10-29: 170 mg via INTRAVENOUS

## 2018-10-29 MED ORDER — SERTRALINE HCL 50 MG PO TABS: 100.0000 mg | ORAL_TABLET | ORAL | Status: DC

## 2018-10-29 MED ORDER — SODIUM CHLORIDE 0.9% FLUSH: 3.0000 mL | INTRAVENOUS | Status: DC | PRN

## 2018-10-29 MED ORDER — ROCURONIUM BROMIDE 10 MG/ML (PF) SYRINGE
PREFILLED_SYRINGE | INTRAVENOUS | Status: DC | PRN
  Administered 2018-10-29: 50 mg via INTRAVENOUS
  Administered 2018-10-29: 20 mg via INTRAVENOUS
  Administered 2018-10-29 (×2): 10 mg via INTRAVENOUS

## 2018-10-29 MED ORDER — MIDAZOLAM HCL 2 MG/2ML IJ SOLN
INTRAMUSCULAR | Status: AC
  Filled 2018-10-29: qty 2

## 2018-10-29 MED ORDER — EPHEDRINE 5 MG/ML INJ
INTRAVENOUS | Status: AC
  Filled 2018-10-29: qty 10

## 2018-10-29 MED ORDER — PROPOFOL 1000 MG/100ML IV EMUL
INTRAVENOUS | Status: AC
  Filled 2018-10-29: qty 100

## 2018-10-29 MED ORDER — ACETAMINOPHEN 325 MG PO TABS: 650.0000 mg | ORAL_TABLET | ORAL | Status: DC | PRN

## 2018-10-29 MED ORDER — DOCUSATE SODIUM 100 MG PO CAPS
100.0000 mg | ORAL_CAPSULE | Freq: Two times a day (BID) | ORAL | Status: DC
  Administered 2018-10-29 (×2): 100 mg via ORAL
  Filled 2018-10-29 (×2): qty 1

## 2018-10-29 MED ORDER — SCOPOLAMINE 1 MG/3DAYS TD PT72
MEDICATED_PATCH | TRANSDERMAL | Status: DC | PRN
  Administered 2018-10-29: 1 via TRANSDERMAL

## 2018-10-29 MED ORDER — KETAMINE HCL 10 MG/ML IJ SOLN
INTRAMUSCULAR | Status: DC | PRN
  Administered 2018-10-29: 30 mg via INTRAVENOUS
  Administered 2018-10-29: 10 mg via INTRAVENOUS

## 2018-10-29 MED ORDER — CEFAZOLIN SODIUM-DEXTROSE 2-4 GM/100ML-% IV SOLN
2.0000 g | Freq: Three times a day (TID) | INTRAVENOUS | Status: AC
  Administered 2018-10-29 (×2): 2 g via INTRAVENOUS
  Filled 2018-10-29 (×2): qty 100

## 2018-10-29 MED ORDER — SODIUM CHLORIDE 0.9 % IV SOLN: 250.0000 mL | INTRAVENOUS | Status: DC

## 2018-10-29 MED ORDER — MIDAZOLAM HCL 5 MG/5ML IJ SOLN
INTRAMUSCULAR | Status: DC | PRN
  Administered 2018-10-29: 2 mg via INTRAVENOUS

## 2018-10-29 MED ORDER — ONDANSETRON HCL 4 MG/2ML IJ SOLN: 4.0000 mg | Freq: Once | INTRAMUSCULAR | Status: DC | PRN

## 2018-10-29 MED ORDER — MENTHOL 3 MG MT LOZG: 1.0000 | LOZENGE | OROMUCOSAL | Status: DC | PRN

## 2018-10-29 MED ORDER — THROMBIN (RECOMBINANT) 20000 UNITS EX SOLR
CUTANEOUS | Status: AC
  Filled 2018-10-29: qty 20000

## 2018-10-29 MED ORDER — LACTATED RINGERS IV SOLN
INTRAVENOUS | Status: DC | PRN
  Administered 2018-10-29 (×3): via INTRAVENOUS

## 2018-10-29 MED ORDER — SCOPOLAMINE 1 MG/3DAYS TD PT72
MEDICATED_PATCH | TRANSDERMAL | Status: AC
  Filled 2018-10-29: qty 1

## 2018-10-29 MED ORDER — ALPRAZOLAM 0.5 MG PO TABS: 0.5000 mg | ORAL_TABLET | Freq: Two times a day (BID) | ORAL | Status: DC | PRN

## 2018-10-29 MED ORDER — OXYCODONE-ACETAMINOPHEN 5-325 MG PO TABS
1.0000 | ORAL_TABLET | ORAL | Status: DC | PRN
  Administered 2018-10-29 – 2018-10-30 (×5): 2 via ORAL
  Filled 2018-10-29 (×5): qty 2

## 2018-10-29 MED ORDER — ALBUMIN HUMAN 5 % IV SOLN
INTRAVENOUS | Status: DC | PRN
  Administered 2018-10-29: 11:00:00 via INTRAVENOUS

## 2018-10-29 MED ORDER — ACETAMINOPHEN 650 MG RE SUPP: 650.0000 mg | RECTAL | Status: DC | PRN

## 2018-10-29 MED ORDER — SODIUM CHLORIDE (PF) 0.9 % IJ SOLN
INTRAMUSCULAR | Status: AC
  Filled 2018-10-29: qty 10

## 2018-10-29 MED ORDER — FLEET ENEMA 7-19 GM/118ML RE ENEM: 1.0000 | ENEMA | Freq: Once | RECTAL | Status: DC | PRN

## 2018-10-29 MED ORDER — LIDOCAINE 2% (20 MG/ML) 5 ML SYRINGE
INTRAMUSCULAR | Status: AC
  Filled 2018-10-29: qty 5

## 2018-10-29 MED ORDER — BUPIVACAINE LIPOSOME 1.3 % IJ SUSP
INTRAMUSCULAR | Status: DC | PRN
  Administered 2018-10-29: 20 mL

## 2018-10-29 MED ORDER — ACETAMINOPHEN 500 MG PO TABS: 1000.0000 mg | ORAL_TABLET | Freq: Once | ORAL | Status: DC

## 2018-10-29 MED ORDER — NALTREXONE HCL 50 MG PO TABS
50.0000 mg | ORAL_TABLET | ORAL | Status: DC
  Filled 2018-10-29: qty 1

## 2018-10-29 MED ORDER — POTASSIUM CHLORIDE IN NACL 20-0.9 MEQ/L-% IV SOLN: INTRAVENOUS | Status: DC

## 2018-10-29 MED ORDER — SODIUM CHLORIDE 0.9 % IV SOLN
INTRAVENOUS | Status: DC | PRN
  Administered 2018-10-29: 13:00:00 via INTRAVENOUS

## 2018-10-29 MED ORDER — ACETAMINOPHEN 10 MG/ML IV SOLN: 1000.0000 mg | Freq: Once | INTRAVENOUS | Status: DC | PRN

## 2018-10-29 MED ORDER — SUFENTANIL CITRATE 50 MCG/ML IV SOLN
INTRAVENOUS | Status: AC
  Filled 2018-10-29: qty 1

## 2018-10-29 MED ORDER — ACETAMINOPHEN 500 MG PO TABS
ORAL_TABLET | ORAL | Status: AC
  Administered 2018-10-29: 1000 mg
  Filled 2018-10-29: qty 2

## 2018-10-29 MED ORDER — KETAMINE HCL 50 MG/5ML IJ SOSY
PREFILLED_SYRINGE | INTRAMUSCULAR | Status: AC
  Filled 2018-10-29: qty 5

## 2018-10-29 MED ORDER — SENNOSIDES-DOCUSATE SODIUM 8.6-50 MG PO TABS: 1.0000 | ORAL_TABLET | Freq: Every evening | ORAL | Status: DC | PRN

## 2018-10-29 MED ORDER — LIDOCAINE 2% (20 MG/ML) 5 ML SYRINGE
INTRAMUSCULAR | Status: DC | PRN
  Administered 2018-10-29: 100 mg via INTRAVENOUS

## 2018-10-29 MED ORDER — HYDROCODONE-ACETAMINOPHEN 7.5-325 MG PO TABS: 1.0000 | ORAL_TABLET | Freq: Once | ORAL | Status: DC | PRN

## 2018-10-29 MED ORDER — SODIUM CHLORIDE 0.9% FLUSH
3.0000 mL | Freq: Two times a day (BID) | INTRAVENOUS | Status: DC
  Administered 2018-10-29: 3 mL via INTRAVENOUS

## 2018-10-29 MED ORDER — PROPOFOL 10 MG/ML IV BOLUS
INTRAVENOUS | Status: AC
  Filled 2018-10-29: qty 20

## 2018-10-29 MED ORDER — ZOLPIDEM TARTRATE 5 MG PO TABS: 5.0000 mg | ORAL_TABLET | Freq: Every evening | ORAL | Status: DC | PRN

## 2018-10-29 MED ORDER — THROMBIN 20000 UNITS EX SOLR
CUTANEOUS | Status: DC | PRN
  Administered 2018-10-29: 20000 [IU] via TOPICAL

## 2018-10-29 MED ORDER — DEXAMETHASONE SODIUM PHOSPHATE 10 MG/ML IJ SOLN
INTRAMUSCULAR | Status: AC
  Filled 2018-10-29: qty 1

## 2018-10-29 MED ORDER — HYDROMORPHONE HCL 1 MG/ML IJ SOLN
0.2500 mg | INTRAMUSCULAR | Status: DC | PRN
  Administered 2018-10-29 (×4): 0.5 mg via INTRAVENOUS

## 2018-10-29 MED ORDER — HEMOSTATIC AGENTS (NO CHARGE) OPTIME
TOPICAL | Status: DC | PRN
  Administered 2018-10-29: 1 via TOPICAL

## 2018-10-29 MED ORDER — METHYLENE BLUE 0.5 % INJ SOLN
INTRAVENOUS | Status: DC | PRN
  Administered 2018-10-29: 2 mL via INTRADERMAL

## 2018-10-29 MED ORDER — GABAPENTIN 300 MG PO CAPS
ORAL_CAPSULE | ORAL | Status: AC
  Administered 2018-10-29: 300 mg
  Filled 2018-10-29: qty 1

## 2018-10-29 MED ORDER — ONDANSETRON HCL 4 MG/2ML IJ SOLN: 4.0000 mg | Freq: Four times a day (QID) | INTRAMUSCULAR | Status: DC | PRN

## 2018-10-29 MED ORDER — DEXAMETHASONE SODIUM PHOSPHATE 10 MG/ML IJ SOLN
INTRAMUSCULAR | Status: DC | PRN
  Administered 2018-10-29: 5 mg via INTRAVENOUS

## 2018-10-29 MED ORDER — GABAPENTIN 300 MG PO CAPS: 300.0000 mg | ORAL_CAPSULE | Freq: Once | ORAL | Status: DC

## 2018-10-29 MED ORDER — POVIDONE-IODINE 7.5 % EX SOLN
Freq: Once | CUTANEOUS | Status: DC
  Filled 2018-10-29: qty 118

## 2018-10-29 MED ORDER — TRAZODONE HCL 50 MG PO TABS
50.0000 mg | ORAL_TABLET | Freq: Every day | ORAL | Status: DC
  Administered 2018-10-29: 50 mg via ORAL
  Filled 2018-10-29: qty 1

## 2018-10-29 MED ORDER — SODIUM CHLORIDE 0.9 % IV SOLN
INTRAVENOUS | Status: DC | PRN
  Administered 2018-10-29: 25 ug/min via INTRAVENOUS

## 2018-10-29 MED ORDER — PROPOFOL 500 MG/50ML IV EMUL
INTRAVENOUS | Status: DC | PRN
  Administered 2018-10-29: 60 ug/kg/min via INTRAVENOUS

## 2018-10-29 MED ORDER — CEFAZOLIN SODIUM 1 G IJ SOLR
INTRAMUSCULAR | Status: AC
  Filled 2018-10-29: qty 20

## 2018-10-29 SURGICAL SUPPLY — 94 items
AGENT HMST KT MTR STRL THRMB (HEMOSTASIS) ×1
APL SKNCLS STERI-STRIP NONHPOA (GAUZE/BANDAGES/DRESSINGS) ×1
BENZOIN TINCTURE PRP APPL 2/3 (GAUZE/BANDAGES/DRESSINGS) ×2 IMPLANT
BLADE CLIPPER SURG (BLADE) IMPLANT
BONE VIVIGEN FORMABLE 10CC (Bone Implant) ×2 IMPLANT
BUR PRESCISION 1.7 ELITE (BURR) ×2 IMPLANT
BUR ROUND FLUTED 5 RND (BURR) ×2 IMPLANT
BUR ROUND PRECISION 4.0 (BURR) IMPLANT
BUR SABER RD CUTTING 3.0 (BURR) IMPLANT
CAGE CONCORDE BULLET 11X12X27 (Cage) ×2 IMPLANT
CAGE CONCORDE BULLET 9X9X23 (Cage) ×2 IMPLANT
CAGE SPNL 5D BLT 23X9X9X (Cage) IMPLANT
CAGE SPNL 5D BLT NOSE 27X11X12 (Cage) IMPLANT
CARTRIDGE OIL MAESTRO DRILL (MISCELLANEOUS) ×1 IMPLANT
CONT SPEC 4OZ CLIKSEAL STRL BL (MISCELLANEOUS) ×2 IMPLANT
COVER MAYO STAND STRL (DRAPES) ×4 IMPLANT
COVER SURGICAL LIGHT HANDLE (MISCELLANEOUS) ×2 IMPLANT
COVER WAND RF STERILE (DRAPES) ×2 IMPLANT
DIFFUSER DRILL AIR PNEUMATIC (MISCELLANEOUS) ×2 IMPLANT
DRAIN CHANNEL 15F RND FF W/TCR (WOUND CARE) ×1 IMPLANT
DRAPE C-ARM 42X72 X-RAY (DRAPES) ×2 IMPLANT
DRAPE C-ARMOR (DRAPES) IMPLANT
DRAPE POUCH INSTRU U-SHP 10X18 (DRAPES) ×2 IMPLANT
DRAPE SURG 17X23 STRL (DRAPES) ×8 IMPLANT
DURAPREP 26ML APPLICATOR (WOUND CARE) ×2 IMPLANT
ELECT BLADE 4.0 EZ CLEAN MEGAD (MISCELLANEOUS) ×2
ELECT CAUTERY BLADE 6.4 (BLADE) ×2 IMPLANT
ELECT REM PT RETURN 9FT ADLT (ELECTROSURGICAL) ×2
ELECTRODE BLDE 4.0 EZ CLN MEGD (MISCELLANEOUS) ×1 IMPLANT
ELECTRODE REM PT RTRN 9FT ADLT (ELECTROSURGICAL) ×1 IMPLANT
EVACUATOR SILICONE 100CC (DRAIN) IMPLANT
FEE INTRAOP MONITOR IMPULS NCS (MISCELLANEOUS) IMPLANT
FILTER STRAW FLUID ASPIR (MISCELLANEOUS) ×2 IMPLANT
GAUZE 4X4 16PLY RFD (DISPOSABLE) ×2 IMPLANT
GAUZE SPONGE 4X4 12PLY STRL (GAUZE/BANDAGES/DRESSINGS) ×2 IMPLANT
GLOVE BIO SURGEON STRL SZ7 (GLOVE) ×2 IMPLANT
GLOVE BIO SURGEON STRL SZ8 (GLOVE) ×2 IMPLANT
GLOVE BIOGEL PI IND STRL 7.0 (GLOVE) ×1 IMPLANT
GLOVE BIOGEL PI IND STRL 8 (GLOVE) ×1 IMPLANT
GLOVE BIOGEL PI INDICATOR 7.0 (GLOVE) ×1
GLOVE BIOGEL PI INDICATOR 8 (GLOVE) ×1
GOWN STRL REUS W/ TWL LRG LVL3 (GOWN DISPOSABLE) ×2 IMPLANT
GOWN STRL REUS W/ TWL XL LVL3 (GOWN DISPOSABLE) ×1 IMPLANT
GOWN STRL REUS W/TWL LRG LVL3 (GOWN DISPOSABLE) ×4
GOWN STRL REUS W/TWL XL LVL3 (GOWN DISPOSABLE) ×2
GRAFT BNE MATRIX VG FRMBL L 10 (Bone Implant) IMPLANT
INTRAOP MONITOR FEE IMPULS NCS (MISCELLANEOUS) ×1
INTRAOP MONITOR FEE IMPULSE (MISCELLANEOUS) ×1
IV CATH 14GX2 1/4 (CATHETERS) ×2 IMPLANT
KIT BASIN OR (CUSTOM PROCEDURE TRAY) ×2 IMPLANT
KIT POSITION SURG JACKSON T1 (MISCELLANEOUS) ×2 IMPLANT
KIT TURNOVER KIT B (KITS) ×2 IMPLANT
MARKER SKIN DUAL TIP RULER LAB (MISCELLANEOUS) ×4 IMPLANT
NDL HYPO 25GX1X1/2 BEV (NEEDLE) ×1 IMPLANT
NDL SAFETY ECLIPSE 18X1.5 (NEEDLE) ×1 IMPLANT
NDL SPNL 18GX3.5 QUINCKE PK (NEEDLE) ×2 IMPLANT
NEEDLE 22X1 1/2 (OR ONLY) (NEEDLE) ×4 IMPLANT
NEEDLE HYPO 18GX1.5 SHARP (NEEDLE) ×2
NEEDLE HYPO 25GX1X1/2 BEV (NEEDLE) ×2 IMPLANT
NEEDLE SPNL 18GX3.5 QUINCKE PK (NEEDLE) ×4 IMPLANT
NS IRRIG 1000ML POUR BTL (IV SOLUTION) ×2 IMPLANT
OIL CARTRIDGE MAESTRO DRILL (MISCELLANEOUS) ×2
PACK LAMINECTOMY ORTHO (CUSTOM PROCEDURE TRAY) ×2 IMPLANT
PACK UNIVERSAL I (CUSTOM PROCEDURE TRAY) ×2 IMPLANT
PAD ARMBOARD 7.5X6 YLW CONV (MISCELLANEOUS) ×4 IMPLANT
PATTIES SURGICAL .5 X.5 (GAUZE/BANDAGES/DRESSINGS) ×1 IMPLANT
PATTIES SURGICAL .5 X1 (DISPOSABLE) ×2 IMPLANT
PATTIES SURGICAL .5X1.5 (GAUZE/BANDAGES/DRESSINGS) ×1 IMPLANT
PROBE PEDCLE PROBE MAGSTM DISP (MISCELLANEOUS) ×1 IMPLANT
ROD EXPEDIUM PER BENT 65MM (Rod) ×2 IMPLANT
SCREW CORTICAL VIPER 7X45MM (Screw) ×2 IMPLANT
SCREW SET SINGLE INNER (Screw) ×6 IMPLANT
SCREW VIPER CORT FIX 6X35 (Screw) ×4 IMPLANT
SPONGE INTESTINAL PEANUT (DISPOSABLE) ×2 IMPLANT
SPONGE LAP 4X18 RFD (DISPOSABLE) ×1 IMPLANT
SPONGE SURGIFOAM ABS GEL 100 (HEMOSTASIS) ×2 IMPLANT
SPONGE SURGIFOAM ABS GEL SZ50 (HEMOSTASIS) ×1 IMPLANT
STRIP CLOSURE SKIN 1/2X4 (GAUZE/BANDAGES/DRESSINGS) ×3 IMPLANT
SURGIFLO W/THROMBIN 8M KIT (HEMOSTASIS) ×1 IMPLANT
SUT ETHILON 2 0 FS 18 (SUTURE) ×1 IMPLANT
SUT MNCRL AB 4-0 PS2 18 (SUTURE) ×2 IMPLANT
SUT VIC AB 0 CT1 18XCR BRD 8 (SUTURE) ×1 IMPLANT
SUT VIC AB 0 CT1 8-18 (SUTURE) ×2
SUT VIC AB 1 CT1 18XCR BRD 8 (SUTURE) ×1 IMPLANT
SUT VIC AB 1 CT1 8-18 (SUTURE) ×4
SUT VIC AB 2-0 CT2 18 VCP726D (SUTURE) ×3 IMPLANT
SYR 20CC LL (SYRINGE) ×4 IMPLANT
SYR BULB IRRIGATION 50ML (SYRINGE) ×2 IMPLANT
SYR CONTROL 10ML LL (SYRINGE) ×4 IMPLANT
SYR TB 1ML LUER SLIP (SYRINGE) ×2 IMPLANT
TAPE CLOTH SURG 4X10 WHT LF (GAUZE/BANDAGES/DRESSINGS) ×1 IMPLANT
TRAY FOLEY MTR SLVR 16FR STAT (SET/KITS/TRAYS/PACK) ×2 IMPLANT
WATER STERILE IRR 1000ML POUR (IV SOLUTION) ×2 IMPLANT
YANKAUER SUCT BULB TIP NO VENT (SUCTIONS) ×2 IMPLANT

## 2018-10-29 NOTE — Progress Notes (Signed)
Orthopedic Tech Progress Note Patient Details:  April Robertson 07/28/1956 [id number]  Patient ID: [name], female   DOB: July 19, 1956, 63 y.o.   MRN: [id number] Pt has brace at home.  Trinna Post 10/29/2018, 3:11 PM

## 2018-10-29 NOTE — Op Note (Signed)
PATIENT NAME: April Robertson   MEDICAL RECORD NO.:   [id number]    PHYSICIAN:  Estill Bamberg, MD      DATE OF BIRTH: 04-09-1956   DATE OF PROCEDURE: 10/29/2018                                                            OPERATIVE REPORT     PREOPERATIVE DIAGNOSES: 1. Right -sided lumbar radiculopathy. 2. Gait abnormality, the patient's workup has suggested that this is due to her stenosis and instability 3. L4-5, L5/S1 spinal stenosis. 4. Severe degenerative disc disease, L4/5, L5/S1    POSTOPERATIVE DIAGNOSES: 1. Right -sided lumbar radiculopathy. 2. Gait abnormality, the patient's workup has suggested that this is due to her stenosis and instability 3. L4-5, L5/S1 spinal stenosis. 4. Severe degenerative disc disease, L4/5, L5/S1    PROCEDURES: 1. Lumbar decompression L4-5, L5-S1 2. Right-sided L4-5, L5/S1 transforaminal lumbar interbody fusion. 3. Left-sided L4/5, L5/S1 posterolateral fusion. 4. Insertion of interbody device x2 (Concorde intervertebral spacers). 5. Placement of posterior instrumentation L4, L5, S1 bilaterally. 6. Use of local autograft. 7. Use of morselized allograft - Vivigen 8. Intraoperative use of fluoroscopy.   SURGEON:  Estill Bamberg, MD.   ASSISTANTJason Coop, PA-C.   ANESTHESIA:  General endotracheal anesthesia.   COMPLICATIONS:  None.   DISPOSITION:  Stable.   ESTIMATED BLOOD LOSS:  300cc   INDICATIONS FOR SURGERY:  Briefly,  Ms. Montie a pleasant 63 year old who did present to me with a substantial gait abnormality.  This was very concerning to her.  Her MRI did reveal instability and severe stenosis at L4-5, with instability and stenosis also noted at L5-S1.  Her work-up for this has been extensive, as I was not initially convinced that her abnormalities on her lumbar spine MRI were correlating to her symptoms.  However, an EMG did support right-sided lumbar radiculopathy.  A hip work-up was negative.  She also had a second  opinion from another spine surgeon, who did also feel that her gait abnormality was due to her stenosis and findings noted above.  Given this, in combination with her ongoing symptoms, we did discuss proceeding with the procedure noted above.  The patient was fully aware of the risks and limitations of surgery and did wish to proceed.    OPERATIVE DETAILS:  On  10/29/2018, the patient was brought to surgery and general endotracheal anesthesia was administered.  The patient was placed prone on a well-padded flat Jackson bed with a spinal frame.  Antibiotics were given and a time-out procedure was performed. The back was prepped and draped in the usual fashion.  A midline incision was made overlying the L4-5 and L5-S1 intervertebral spaces.  The fascia was incised at the midline.  The paraspinal musculature was bluntly swept laterally.  Anatomic landmarks for the pedicles were exposed. Using fluoroscopy, I did cannulate the L4, L5, and S1 pedicles bilaterally, using a medial to lateral cortical trajectory technique at L4 and L5, and using a straight/medial trajectory technique at S1 bilaterally.  On the left side, the posterolateral gutter and left facet joint at L4-5 and L5-S1 was decorticated and 6 mm screws of the appropriate length were placed at L4 and L5, and a 7 mm screw was placed at S1 and a 65-mm rod  was placed and distraction was applied across the rod at each intervertebral level.  On the right side, the cannulated pedicle holes were filled with bone wax.  I then proceeded with the decompressive aspect of the procedure.     Starting at L5-S1, I did perform a laminotomy and a full facetectomy on the right.  There was moderate neuroforaminal stenosis on the right at this level, and there was noted to be prominent epidural lipomatosis.  The epidural lipomatosis and stenosis was thoroughly addressed, and the canal was thoroughly decompressed.Then, with an assistant holding medial retraction of  the traversing right S1 nerve, I did perform a thorough and complete L5-S1 intervertebral discectomy.  The intervertebral space was then liberally packed with autograft from the decompression, as well as allograft in the form of Vivigen, as was the appropriately sized intervertebral spacer (12 mm).  Distraction was then released on the contralateral left side.  I then turned my attention to the L4-5 level.  At this level, it was clearly evident that there was very severe stenosis.  The spinal canal was significantly compressed due to facet hypertrophy.  A thorough decompression was performed, entirely decompressing the right spinal canal and neuroforamen.  With the assistant holding medial retraction of the traversing right L5 nerve, I did perform an annulotomy at the posterolateral aspect of the L4-5 intervertebral space.  I then used a series of curettes and pituitary rongeurs to perform a thorough and complete intervertebral diskectomy.  The intervertebral space was then liberally packed with autograft as well as allograft in the form of Vivigen, as was the appropriate-sized intervertebral spacer (9 mm).  The spacer was then tamped into position in the usual fashion.  I was very pleased with the press-fit of the spacer.  I then placed 6 mm screws on the right at L4 and L5, and a 7 mm screw at S1 on the right side.  A 65-mm rod was then placed and caps were placed. The distraction was then released on the contralateral left side.  All 6 caps were then locked.  The wound was copiously irrigated with a total of approximately 3 L prior to placing the bone graft.  Additional autograft and allograft were then packed into the posterolateral gutter on the left side to help aid in the success of the fusion.  The wound was  explored for any undue bleeding and there was no substantial bleeding encountered.  Gel-Foam was placed over the laminectomy site.  The wound was then closed in layers using #1 Vicryl  followed by 2-0 Vicryl, followed by 4-0 Monocryl.  Benzoin and Steri-Strips were applied followed by sterile dressing.  Of note, a deep #15 Blake drain was placed prior to closure.   Of note, did use triggered EMG to test the screws on the left, and there is no screw the tested below 20 mA.  There was no sustained abnormal EMG activity noted throughout the surgery.   Of note, Jason Coop was my assistant throughout surgery, and did aid in retraction, suctioning, and closure.       Estill Bamberg, MD

## 2018-10-29 NOTE — Transfer of Care (Signed)
Immediate Anesthesia Transfer of Care Note  Patient: April Robertson  Procedure(s) Performed: RIGHT - SIDED LUMBAR 4 - LUMBAR 5, LUMBAR 5 - SACRUM 1 TRANSFORAMINAL LUMBAR INTERBODY FUSION WITH INSTRUMENTATION AND ALLOGRAFT (Right Spine Lumbar)  Patient Location: PACU  Anesthesia Type:General  Level of Consciousness: awake and patient cooperative  Airway & Oxygen Therapy: Patient Spontanous Breathing and Patient connected to nasal cannula oxygen  Post-op Assessment: Report given to RN, Post -op Vital signs reviewed and stable and Patient moving all extremities  Post vital signs: Reviewed and stable  Last Vitals:  Vitals Value Taken Time  BP 102/70 10/29/2018  1:29 PM  Temp    Pulse 76 10/29/2018  1:30 PM  Resp 13 10/29/2018  1:30 PM  SpO2 100 % 10/29/2018  1:30 PM  Vitals shown include unvalidated device data.  Last Pain:  Vitals:   10/29/18 0649  TempSrc:   PainSc: 0-No pain         Complications: No apparent anesthesia complications

## 2018-10-29 NOTE — H&P (Signed)
PREOPERATIVE H&P  Chief Complaint: Low back pain, abnormal gait  HPI: April Robertson is a 63 y.o. female who presents with ongoing pain in the back with an abnormal gait  MRI reveals stenosis and DDD at L4/5 and L5/S1. EMG reveals radiculopathy.   Patient has failed multiple forms of conservative care and continues to have pain (see office notes for additional details regarding the patient's full course of treatment)  Past Medical History:  Diagnosis Date  . Anxiety   . Heart valve replaced   . Hypertension   . Insomnia   . Mitral valve prolapse    history of mitral valve prolapse and mitral valve repair 2010. 2-D echo 2011 with LVEF greater than 70% and mild mitral regurgitation  . Mitral valve regurgitation   . Osteoarthritis   . Osteopenia 09/2017   T score -1.8 FRAX 8.2% / 0.8%  . SVT (supraventricular tachycardia) (HCC)   . Tachycardia    Past Surgical History:  Procedure Laterality Date  . AUGMENTATION MAMMAPLASTY Bilateral   . BREAST SURGERY     bilateral breast implants  . KNEE ARTHROPLASTY Right 11/23/2012   Procedure: COMPUTER ASSISTED TOTAL KNEE ARTHROPLASTY;  Surgeon: Harvie Junior, MD;  Location: [facility] OR;  Service: Orthopedics;  Laterality: Right;  GENERAL WITH PRE OP FEMORALL NERVE BLOCK  . MITRAL VALVE REPAIR  2010   minimally invasive   Social History   Socioeconomic History  . Marital status: Married    Spouse name: Not on file  . Number of children: Not on file  . Years of education: Not on file  . Highest education level: Not on file  Occupational History  . Not on file  Social Needs  . Financial resource strain: Not on file  . Food insecurity:    Worry: Not on file    Inability: Not on file  . Transportation needs:    Medical: Not on file    Non-medical: Not on file  Tobacco Use  . Smoking status: Never Smoker  . Smokeless tobacco: Never Used  Substance and Sexual Activity  . Alcohol use: Not Currently    Alcohol/week: 2.0  standard drinks    Types: 2 Glasses of wine per week    Comment: 3 drinks a day - wine   . Drug use: No  . Sexual activity: Yes    Partners: Male    Comment: 1st intercourse- 78, partners-4,  married- 32 yrs   Lifestyle  . Physical activity:    Days per week: Not on file    Minutes per session: Not on file  . Stress: Not on file  Relationships  . Social connections:    Talks on phone: Not on file    Gets together: Not on file    Attends religious service: Not on file    Active member of club or organization: Not on file    Attends meetings of clubs or organizations: Not on file    Relationship status: Not on file  Other Topics Concern  . Not on file  Social History Narrative  . Not on file   Family History  Adopted: Yes  Family history unknown: Yes   No Known Allergies Prior to Admission medications   Medication Sig Start Date End Date Taking? Authorizing Provider  ALPRAZolam Prudy Feeler) 0.5 MG tablet Take 0.5 mg by mouth 2 (two) times daily as needed for anxiety.  12/20/15  Yes [provider]  metoprolol tartrate (LOPRESSOR) 50 MG tablet  Take 75 mg by mouth 2 (two) times daily. 09/13/18  Yes [provider]  naltrexone (DEPADE) 50 MG tablet Take 50 mg by mouth every morning. 09/17/18  Yes [provider]  OVER THE COUNTER MEDICATION Take 1 tablet by mouth daily. Vitamin B12, Folic Acid, Vitamin D   Yes [provider]  sertraline (ZOLOFT) 100 MG tablet Take 100 mg by mouth every morning. 08/31/18  Yes [provider]  traZODone (DESYREL) 50 MG tablet Take 50 mg by mouth at bedtime. 08/05/18  Yes [provider]  Metoprolol Tartrate 75 MG TABS Take 1 tablet by mouth 2 (two) times daily. Pt needs to call and schedule appointment for further refills. Patient not taking: Reported on 10/21/2018 03/12/17   Janetta Hora, PA-C     All other systems have been reviewed and were otherwise negative with the exception of those mentioned  in the HPI and as above.  Physical Exam: Vitals:   10/29/18 0555  BP: 109/72  Pulse: 63  Resp: 18  Temp: 97.7 F (36.5 C)  SpO2: 97%    There is no height or weight on file to calculate BMI.  General: Alert, no acute distress Cardiovascular: No pedal edema Respiratory: No cyanosis, no use of accessory musculature Skin: No lesions in the area of chief complaint Neurologic: Sensation intact distally Psychiatric: Patient is competent for consent with normal mood and affect Lymphatic: No axillary or cervical lymphadenopathy  MUSCULOSKELETAL: + TTP low back  Assessment/Plan: SEVERE  STENOSIS AT LUMBAR 4-LUMBAR 5 WITH NEUROFORAMINAL STENOSIS ON THE RIGHT LUMBAR 5 - SACRUM 1 Plan for Procedure(s): RIGHT - SIDED LUMBAR 4 - LUMBAR 5, LUMBAR 5 - SACRUM 1 TRANSFORAMINAL LUMBAR INTERBODY FUSION WITH INSTRUMENTATION AND ALLOGRAFT   Jackelyn Hoehn, MD 10/29/2018 7:31 AM

## 2018-10-29 NOTE — Anesthesia Procedure Notes (Signed)
Procedure Name: Intubation Date/Time: 10/29/2018 7:41 AM Performed by: Moshe Salisbury, CRNA Pre-anesthesia Checklist: Patient identified, Emergency Drugs available, Suction available and Patient being monitored Patient Re-evaluated:Patient Re-evaluated prior to induction Oxygen Delivery Method: Circle System Utilized Preoxygenation: Pre-oxygenation with 100% oxygen Induction Type: IV induction Ventilation: Mask ventilation without difficulty Laryngoscope Size: Mac and 4 Grade View: Grade II Tube type: Oral Tube size: 7.5 mm Number of attempts: 1 Airway Equipment and Method: Stylet Placement Confirmation: ETT inserted through vocal cords under direct vision,  positive ETCO2 and breath sounds checked- equal and bilateral Secured at: 21 cm Tube secured with: Tape Dental Injury: Teeth and Oropharynx as per pre-operative assessment

## 2018-10-29 NOTE — Anesthesia Postprocedure Evaluation (Signed)
Anesthesia Post Note  Patient: April Robertson  Procedure(s) Performed: RIGHT - SIDED LUMBAR 4 - LUMBAR 5, LUMBAR 5 - SACRUM 1 TRANSFORAMINAL LUMBAR INTERBODY FUSION WITH INSTRUMENTATION AND ALLOGRAFT (Right Spine Lumbar)     Patient location during evaluation: PACU Anesthesia Type: General Level of consciousness: awake and alert Pain management: pain level controlled Vital Signs Assessment: post-procedure vital signs reviewed and stable Respiratory status: spontaneous breathing, nonlabored ventilation, respiratory function stable and patient connected to nasal cannula oxygen Cardiovascular status: blood pressure returned to baseline and stable Postop Assessment: no apparent nausea or vomiting Anesthetic complications: no    Last Vitals:  Vitals:   10/29/18 1445 10/29/18 1508  BP:  123/85  Pulse: 69 71  Resp: 14 18  Temp:  36.6 C  SpO2: 99% 100%    Last Pain:  Vitals:   10/29/18 1508  TempSrc: Oral  PainSc:                  Trevor Iha

## 2018-10-30 ENCOUNTER — Encounter (HOSPITAL_COMMUNITY): Payer: Self-pay | Admitting: Orthopedic Surgery

## 2018-10-30 MED ORDER — OXYCODONE-ACETAMINOPHEN 5-325 MG PO TABS: 1.0000 | ORAL_TABLET | ORAL | 0 refills | Status: AC | PRN

## 2018-10-30 MED ORDER — DIAZEPAM 5 MG PO TABS: 5.0000 mg | ORAL_TABLET | Freq: Four times a day (QID) | ORAL | 0 refills | Status: AC | PRN

## 2018-10-30 MED FILL — Thrombin (Recombinant) For Soln 20000 Unit: CUTANEOUS | Qty: 1 | Status: AC

## 2018-10-30 NOTE — Evaluation (Signed)
Occupational Therapy Evaluation Patient Details Name: April Robertson MRN: [id number] DOB: May 16, 1956 Today's Date: 10/30/2018    History of Present Illness 63 yo female s/p L4-S1 fusion PMH  Anxiety, Heart valve replaced, Hypertension, Insomnia, Mitral valve prolapse, Mitral valve regurgitation, Osteoarthritis, Osteopenia (09/2017), SVT (supraventricular tachycardia) (HCC), and Tachycardia.    Clinical Impression   Patient evaluated by Occupational Therapy with no further acute OT needs identified. All education has been completed and the patient has no further questions. See below for any follow-up Occupational Therapy or equipment needs. OT to sign off. Thank you for referral.      Follow Up Recommendations  No OT follow up    Equipment Recommendations  None recommended by OT    Recommendations for Other Services       Precautions / Restrictions Precautions Precautions: Back Precaution Comments: back handout reviewed for adls Required Braces or Orthoses: Spinal Brace Spinal Brace: Applied in sitting position;Lumbar corset Restrictions Weight Bearing Restrictions: No      Mobility Bed Mobility               General bed mobility comments: in chair on arrival  Transfers Overall transfer level: Independent               General transfer comment: requires use of hands    Balance                                           ADL either performed or assessed with clinical judgement   ADL Overall ADL's : Modified independent                                       General ADL Comments: pt edcuated on don doff brace and demonstrates x3 during session. pt dressing for home and all questions about home clothing answered.   Back handout provided and reviewed adls in detail. Pt educated on: clothing between brace, never sleep in brace, set an alarm at night for medication, avoid sitting for long periods of time, correct bed  positioning for sleeping, correct sequence for bed mobility, avoiding lifting more than 5 pounds and never wash directly over incision. All education is complete and patient indicates understanding.     Vision Patient Visual Report: No change from baseline       Perception     Praxis      Pertinent Vitals/Pain Pain Assessment: No/denies pain     Hand Dominance Left   Extremity/Trunk Assessment Upper Extremity Assessment Upper Extremity Assessment: Overall WFL for tasks assessed       Cervical / Trunk Assessment Cervical / Trunk Assessment: Other exceptions(s/p surg)   Communication Communication Communication: No difficulties   Cognition Arousal/Alertness: Awake/alert Behavior During Therapy: WFL for tasks assessed/performed Overall Cognitive Status: Within Functional Limits for tasks assessed                                     General Comments  dressing dry and in tact at this time    Exercises     Shoulder Instructions      Home Living Family/patient expects to be discharged to:: Private residence Living Arrangements: Spouse/significant other Available Help at Discharge: Family Type of  Home: House Home Access: Stairs to enter Secretary/administrator of Steps: 2   Home Layout: One level     Bathroom Shower/Tub: Producer, television/film/video: Standard     Home Equipment: None   Additional Comments: 2 cats       Prior Functioning/Environment Level of Independence: Independent                 OT Problem List:        OT Treatment/Interventions:      OT Goals(Current goals can be found in the care plan section) Acute Rehab OT Goals Patient Stated Goal: to go home today  OT Frequency:     Barriers to D/C:            Co-evaluation              AM-PAC OT "6 Clicks" Daily Activity     Outcome Measure Help from another person eating meals?: None Help from another person taking care of personal grooming?:  None Help from another person toileting, which includes using toliet, bedpan, or urinal?: None Help from another person bathing (including washing, rinsing, drying)?: None Help from another person to put on and taking off regular upper body clothing?: None Help from another person to put on and taking off regular lower body clothing?: None 6 Click Score: 24   End of Session Equipment Utilized During Treatment: Back brace Nurse Communication: Mobility status;Precautions  Activity Tolerance: Patient tolerated treatment well Patient left: in chair;with call bell/phone within reach;with family/visitor present  OT Visit Diagnosis: Unsteadiness on feet (R26.81)                Time: 7169-6789 OT Time Calculation (min): 19 min Charges:  OT General Charges $OT Visit: 1 Visit OT Evaluation $OT Eval Moderate Complexity: 1 Mod   Mateo Flow, OTR/L  Acute Rehabilitation Services Pager: [phone] Office: [phone] .   Mateo Flow 10/30/2018, 10:09 AM

## 2018-10-30 NOTE — Progress Notes (Signed)
    Patient doing well  Denies leg pain Walking better Minimal back pain   Physical Exam: Vitals:   10/29/18 2340 10/30/18 0300  BP: 93/67 96/68  Pulse: 67 61  Resp: 18 18  Temp: 98 F (36.7 C) 97.9 F (36.6 C)  SpO2: 94% 99%    Dressing in place NVI  Drain output: 60 over last 6 hours  POD #1 s/p L4-S1 decompression and fusion, doing well  - up with PT/OT, encourage ambulation - Percocet for pain, Valium for muscle spasms - d/c home today with f/u in 2 weeks - will d/c drain prior to discharge

## 2018-10-30 NOTE — Evaluation (Signed)
Physical Therapy Evaluation Patient Details Name: April Robertson MRN: [id number] DOB: 10/07/1955 Today's Date: 10/30/2018   History of Present Illness  63 yo female s/p L4-S1 fusion PMH significant for SVT, osteopenia, OA, mitral valve prolapse s/p valve replacement, HTN, anxiety.  Clinical Impression  Pt admitted with above diagnosis. Pt currently with functional limitations due to the deficits listed below (see PT Problem List). At the time of PT eval pt was able to perform transfers with modified independence and ambulation with up to min assist for balance support and safety. Pt was educated on precautions, brace application/wearing schedule, activity progression, car transfer and positioning recommendations. Pt will benefit from skilled PT to increase their independence and safety with mobility to allow discharge to the venue listed below.       Follow Up Recommendations No PT follow up;Supervision - Intermittent    Equipment Recommendations  None recommended by PT    Recommendations for Other Services       Precautions / Restrictions Precautions Precautions: Back Precaution Booklet Issued: Yes (comment) Precaution Comments: Reviewed in detail. Pt was cued for precautions during functional mobility.  Required Braces or Orthoses: Spinal Brace Spinal Brace: Thoracolumbosacral orthotic;Applied in sitting position Restrictions Weight Bearing Restrictions: No      Mobility  Bed Mobility Overal bed mobility: Modified Independent             General bed mobility comments: HOB flat and rails lowered. Bed height raised to simulate home environment.   Transfers Overall transfer level: Modified independent Equipment used: None             General transfer comment: Increased time to power-up to full standing position. No assist required.   Ambulation/Gait Ambulation/Gait assistance: Supervision;Min assist Gait Distance (Feet): 400 Feet Assistive device:  None Gait Pattern/deviations: Step-through pattern;Decreased stride length;Trunk flexed;Wide base of support Gait velocity: Decreased Gait velocity interpretation: 1.31 - 2.62 ft/sec, indicative of limited community ambulator General Gait Details: Pt with almost a waddling gait pattern. Very short step/stride length with wide BOS. Supervision if pt ambulating this way. Pt cued for narrower BOS and longer step/stride length, and she was able to make corrective changes with min assist for balance support.   Stairs Stairs: Yes Stairs assistance: Min guard Stair Management: Step to pattern;Forwards Number of Stairs: 10 General stair comments: VC's for sequencing and general safety. No assist required.   Wheelchair Mobility    Modified Rankin (Stroke Patients Only)       Balance Overall balance assessment: Needs assistance Sitting-balance support: Feet supported;No upper extremity supported Sitting balance-Leahy Scale: Fair     Standing balance support: No upper extremity supported Standing balance-Leahy Scale: Poor Standing balance comment: Occasional min assist                              Pertinent Vitals/Pain Pain Assessment: No/denies pain    Home Living Family/patient expects to be discharged to:: Private residence Living Arrangements: Spouse/significant other Available Help at Discharge: Family Type of Home: House Home Access: Stairs to enter   Secretary/administrator of Steps: 3 Home Layout: One level Home Equipment: None Additional Comments: 2 cats     Prior Function Level of Independence: Independent               Hand Dominance   Dominant Hand: Left    Extremity/Trunk Assessment   Upper Extremity Assessment Upper Extremity Assessment: Overall WFL for tasks assessed  Lower Extremity Assessment Lower Extremity Assessment: Generalized weakness(Consistent with pre-op diagnosis)    Cervical / Trunk Assessment Cervical / Trunk  Assessment: Other exceptions(s/p surg)  Communication   Communication: No difficulties  Cognition Arousal/Alertness: Awake/alert Behavior During Therapy: WFL for tasks assessed/performed Overall Cognitive Status: Within Functional Limits for tasks assessed                                        General Comments General comments (skin integrity, edema, etc.): dressing dry and in tact at this time    Exercises     Assessment/Plan    PT Assessment Patient needs continued PT services  PT Problem List Decreased strength;Decreased activity tolerance;Decreased balance;Decreased mobility;Decreased knowledge of use of DME;Decreased safety awareness;Decreased knowledge of precautions;Pain       PT Treatment Interventions DME instruction;Gait training;Stair training;Functional mobility training;Therapeutic activities;Therapeutic exercise;Neuromuscular re-education;Patient/family education    PT Goals (Current goals can be found in the Care Plan section)  Acute Rehab PT Goals Patient Stated Goal: to go home today PT Goal Formulation: With patient/family Time For Goal Achievement: 11/06/18 Potential to Achieve Goals: Good    Frequency Min 5X/week   Barriers to discharge        Co-evaluation               AM-PAC PT "6 Clicks" Mobility  Outcome Measure Help needed turning from your back to your side while in a flat bed without using bedrails?: None Help needed moving from lying on your back to sitting on the side of a flat bed without using bedrails?: None Help needed moving to and from a bed to a chair (including a wheelchair)?: None Help needed standing up from a chair using your arms (e.g., wheelchair or bedside chair)?: None Help needed to walk in hospital room?: A Little Help needed climbing 3-5 steps with a railing? : None 6 Click Score: 23    End of Session Equipment Utilized During Treatment: Gait belt;Back brace Activity Tolerance: Patient tolerated  treatment well Patient left: in chair;with call bell/phone within reach;with family/visitor present Nurse Communication: Mobility status PT Visit Diagnosis: Unsteadiness on feet (R26.81);Pain;Other symptoms and signs involving the nervous system (R29.898) Pain - part of body: (back)    Time: 5625-6389 PT Time Calculation (min) (ACUTE ONLY): 27 min   Charges:   PT Evaluation $PT Eval Moderate Complexity: 1 Mod PT Treatments $Gait Training: 8-22 mins        Conni Slipper, PT, DPT Acute Rehabilitation Services Pager: [phone] Office: [phone]   Marylynn Pearson 10/30/2018, 11:26 AM

## 2018-10-30 NOTE — Progress Notes (Signed)
Patient is discharged from room 3C02 at this time. Alert and in stable condition. IV site d/c'd and instructions read to patient and spouse with understanding verbalized. Left unit via wheelchair with all belongings at side 

## 2018-11-04 MED FILL — Sodium Chloride IV Soln 0.9%: INTRAVENOUS | Qty: 1000 | Status: AC

## 2018-11-04 MED FILL — Heparin Sodium (Porcine) Inj 1000 Unit/ML: INTRAMUSCULAR | Qty: 30 | Status: AC

## 2018-11-05 NOTE — Discharge Summary (Signed)
Patient ID: April Robertson MRN: [id number] DOB/AGE: 1956/03/07 63 y.o.  Admit date: 10/29/2018 Discharge date: 10/30/2018  Admission Diagnoses:  Active Problems:   Spinal stenosis   Discharge Diagnoses:  Same  Past Medical History:  Diagnosis Date  . Anxiety   . Heart valve replaced   . Hypertension   . Insomnia   . Mitral valve prolapse    history of mitral valve prolapse and mitral valve repair 2010. 2-D echo 2011 with LVEF greater than 70% and mild mitral regurgitation  . Mitral valve regurgitation   . Osteoarthritis   . Osteopenia 09/2017   T score -1.8 FRAX 8.2% / 0.8%  . SVT (supraventricular tachycardia) (HCC)   . Tachycardia     Surgeries: Procedure(s): RIGHT - SIDED LUMBAR 4 - LUMBAR 5, LUMBAR 5 - SACRUM 1 TRANSFORAMINAL LUMBAR INTERBODY FUSION WITH INSTRUMENTATION AND ALLOGRAFT on 10/29/2018   Consultants: None  Discharged Condition: Improved  Hospital Course: April Robertson is an 63 y.o. female who was admitted 10/29/2018 for operative treatment of radiculopathy. Patient has severe unremitting pain that affects sleep, daily activities, and work/hobbies. After pre-op clearance the patient was taken to the operating room on 10/29/2018 and underwent  Procedure(s): RIGHT - SIDED LUMBAR 4 - LUMBAR 5, LUMBAR 5 - SACRUM 1 TRANSFORAMINAL LUMBAR INTERBODY FUSION WITH INSTRUMENTATION AND ALLOGRAFT.    Patient was given perioperative antibiotics:  Anti-infectives (From admission, onward)   Start     Dose/Rate Route Frequency Ordered Stop   10/29/18 1515  ceFAZolin (ANCEF) IVPB 2g/100 mL premix     2 g 200 mL/hr over 30 Minutes Intravenous Every 8 hours 10/29/18 1506 10/30/18 0740   10/29/18 0645  ceFAZolin (ANCEF) IVPB 2g/100 mL premix     2 g 200 mL/hr over 30 Minutes Intravenous On call to O.R. 10/29/18 4098 10/29/18 1201   10/29/18 0637  ceFAZolin (ANCEF) 2-4 GM/100ML-% IVPB    Note to Pharmacy:  Clydene Laming   : cabinet override      10/29/18 0637  10/29/18 0732       Patient was given sequential compression devices, early ambulation to prevent DVT.  Patient benefited maximally from hospital stay and there were no complications.    Recent vital signs: BP 100/68 (BP Location: Left Arm)   Pulse (!) 121   Temp 97.6 F (36.4 C) (Oral)   Resp 16   Ht 5\' 3"  (1.6 m)   Wt 60.5 kg   LMP 06/25/2012   SpO2 100%   BMI 23.63 kg/m    Discharge Medications:   Allergies as of 10/30/2018   No Known Allergies     Medication List    TAKE these medications   ALPRAZolam 0.5 MG tablet Commonly known as:  XANAX Take 0.5 mg by mouth 2 (two) times daily as needed for anxiety.   diazepam 5 MG tablet Commonly known as:  VALIUM Take 1 tablet (5 mg total) by mouth every 6 (six) hours as needed for muscle spasms.   Metoprolol Tartrate 75 MG Tabs Take 1 tablet by mouth 2 (two) times daily. Pt needs to call and schedule appointment for further refills.   metoprolol tartrate 50 MG tablet Commonly known as:  LOPRESSOR Take 75 mg by mouth 2 (two) times daily.   naltrexone 50 MG tablet Commonly known as:  DEPADE Take 50 mg by mouth every morning.   OVER THE COUNTER MEDICATION Take 1 tablet by mouth daily. Vitamin B12, Folic Acid, Vitamin D   oxyCODONE-acetaminophen  5-325 MG tablet Commonly known as:  PERCOCET/ROXICET Take 1-2 tablets by mouth every 4 (four) hours as needed for moderate pain or severe pain.   sertraline 100 MG tablet Commonly known as:  ZOLOFT Take 100 mg by mouth every morning.   traZODone 50 MG tablet Commonly known as:  DESYREL Take 50 mg by mouth at bedtime.       Diagnostic Studies: Dg Lumbar Spine 2-3 Views  Result Date: 10/29/2018 CLINICAL DATA:  Status post surgical posterior fusion of lower lumbar spine. EXAM: DG C-ARM 61-120 MIN; LUMBAR SPINE - 2-3 VIEW FLUOROSCOPY TIME:  1 minutes 50 seconds. COMPARISON:  MRI of September 22, 2018. FINDINGS: Two intraoperative fluoroscopic images of lower lumbar spine  demonstrate the patient be status post surgical posterior fusion of L4-5 and L5-S1 with bilateral intrapedicular screw placement. IMPRESSION: Status post surgical posterior fusion of L4-5 and L5-S1. Electronically Signed   By: Lupita Raider, M.D.   On: 10/29/2018 14:30   Dg Lumbar Spine 1 View  Result Date: 10/29/2018 CLINICAL DATA:  Lumbar fusion EXAM: LUMBAR SPINE - 1 VIEW COMPARISON:  09/22/2018 FINDINGS: Initial lateral film in the operating room shows a needle in the posterior soft tissues adjacent to the L5 spinous process. Stable anterolisthesis of L4 on L5 is noted. IMPRESSION: Intraoperative localization at L5. Electronically Signed   By: Alcide Clever M.D.   On: 10/29/2018 11:26   Dg C-arm 1-60 Min  Result Date: 10/29/2018 CLINICAL DATA:  Status post surgical posterior fusion of lower lumbar spine. EXAM: DG C-ARM 61-120 MIN; LUMBAR SPINE - 2-3 VIEW FLUOROSCOPY TIME:  1 minutes 50 seconds. COMPARISON:  MRI of September 22, 2018. FINDINGS: Two intraoperative fluoroscopic images of lower lumbar spine demonstrate the patient be status post surgical posterior fusion of L4-5 and L5-S1 with bilateral intrapedicular screw placement. IMPRESSION: Status post surgical posterior fusion of L4-5 and L5-S1. Electronically Signed   By: Lupita Raider, M.D.   On: 10/29/2018 14:30   Mm 3d Screen Breast W/implant Bilateral  Result Date: 10/27/2018 CLINICAL DATA:  Screening. EXAM: DIGITAL SCREENING BILATERAL MAMMOGRAM WITH IMPLANTS, CAD AND TOMO The patient has bilateral retropectoral saline implants. Standard and implant displaced views were performed. COMPARISON:  Previous exam(s). ACR Breast Density Category c: The breast tissue is heterogeneously dense, which may obscure small masses. FINDINGS: There are no findings suspicious for malignancy. Images were processed with CAD. IMPRESSION: No mammographic evidence of malignancy. A result letter of this screening mammogram will be mailed directly to the patient.  RECOMMENDATION: Screening mammogram in one year. (Code:SM-B-01Y) BI-RADS CATEGORY  1:  Negative. Electronically Signed   By: Edwin Cap M.D.   On: 10/27/2018 16:41    Disposition:    POD #1 s/p L4-S1 decompression and fusion, doing well  - up with PT/OT, encourage ambulation - Percocet for pain, Valium for muscle spasms - d/c home today with f/u in 2 weeks  Signed: Eilene Ghazi Lilinoe Acklin 11/05/2018, 11:59 AM

## 2018-11-11 DIAGNOSIS — Z9889 Other specified postprocedural states: Secondary | ICD-10-CM | POA: Diagnosis not present

## 2018-11-24 ENCOUNTER — Other Ambulatory Visit: Payer: Self-pay

## 2018-11-26 ENCOUNTER — Encounter: Payer: BLUE CROSS/BLUE SHIELD | Admitting: Obstetrics & Gynecology

## 2018-12-14 DIAGNOSIS — Z9889 Other specified postprocedural states: Secondary | ICD-10-CM | POA: Diagnosis not present

## 2018-12-21 ENCOUNTER — Encounter: Payer: BLUE CROSS/BLUE SHIELD | Admitting: Obstetrics & Gynecology

## 2018-12-25 ENCOUNTER — Other Ambulatory Visit: Payer: Self-pay

## 2018-12-28 ENCOUNTER — Encounter: Payer: BLUE CROSS/BLUE SHIELD | Admitting: Obstetrics & Gynecology

## 2019-02-11 DIAGNOSIS — M25562 Pain in left knee: Secondary | ICD-10-CM | POA: Diagnosis not present

## 2019-02-11 DIAGNOSIS — M1712 Unilateral primary osteoarthritis, left knee: Secondary | ICD-10-CM | POA: Diagnosis not present

## 2019-02-22 DIAGNOSIS — M25562 Pain in left knee: Secondary | ICD-10-CM | POA: Diagnosis not present

## 2019-03-02 DIAGNOSIS — M25562 Pain in left knee: Secondary | ICD-10-CM | POA: Diagnosis not present

## 2019-03-08 DIAGNOSIS — Z96651 Presence of right artificial knee joint: Secondary | ICD-10-CM | POA: Diagnosis not present

## 2019-03-08 DIAGNOSIS — S8391XA Sprain of unspecified site of right knee, initial encounter: Secondary | ICD-10-CM | POA: Diagnosis not present

## 2019-03-17 DIAGNOSIS — Z96651 Presence of right artificial knee joint: Secondary | ICD-10-CM | POA: Diagnosis not present

## 2019-03-17 DIAGNOSIS — S8391XA Sprain of unspecified site of right knee, initial encounter: Secondary | ICD-10-CM | POA: Diagnosis not present

## 2019-05-18 ENCOUNTER — Encounter: Payer: Self-pay | Admitting: Gynecology

## 2019-05-21 DIAGNOSIS — Z96651 Presence of right artificial knee joint: Secondary | ICD-10-CM | POA: Diagnosis not present

## 2019-05-21 DIAGNOSIS — R531 Weakness: Secondary | ICD-10-CM | POA: Diagnosis not present

## 2019-05-25 DIAGNOSIS — Z96651 Presence of right artificial knee joint: Secondary | ICD-10-CM | POA: Diagnosis not present

## 2019-05-25 DIAGNOSIS — R531 Weakness: Secondary | ICD-10-CM | POA: Diagnosis not present

## 2019-05-27 DIAGNOSIS — R531 Weakness: Secondary | ICD-10-CM | POA: Diagnosis not present

## 2019-05-27 DIAGNOSIS — Z96651 Presence of right artificial knee joint: Secondary | ICD-10-CM | POA: Diagnosis not present

## 2019-06-01 DIAGNOSIS — Z96651 Presence of right artificial knee joint: Secondary | ICD-10-CM | POA: Diagnosis not present

## 2019-06-01 DIAGNOSIS — R531 Weakness: Secondary | ICD-10-CM | POA: Diagnosis not present

## 2019-06-15 IMAGING — CR DG CHEST 2V
2 series · 2 of 2 positions shown · non-contrast
Comparison: 11/16/2012

CLINICAL DATA: Shortness of breath for 1 year

EXAM:
CHEST  2 VIEW

[w chest pa]
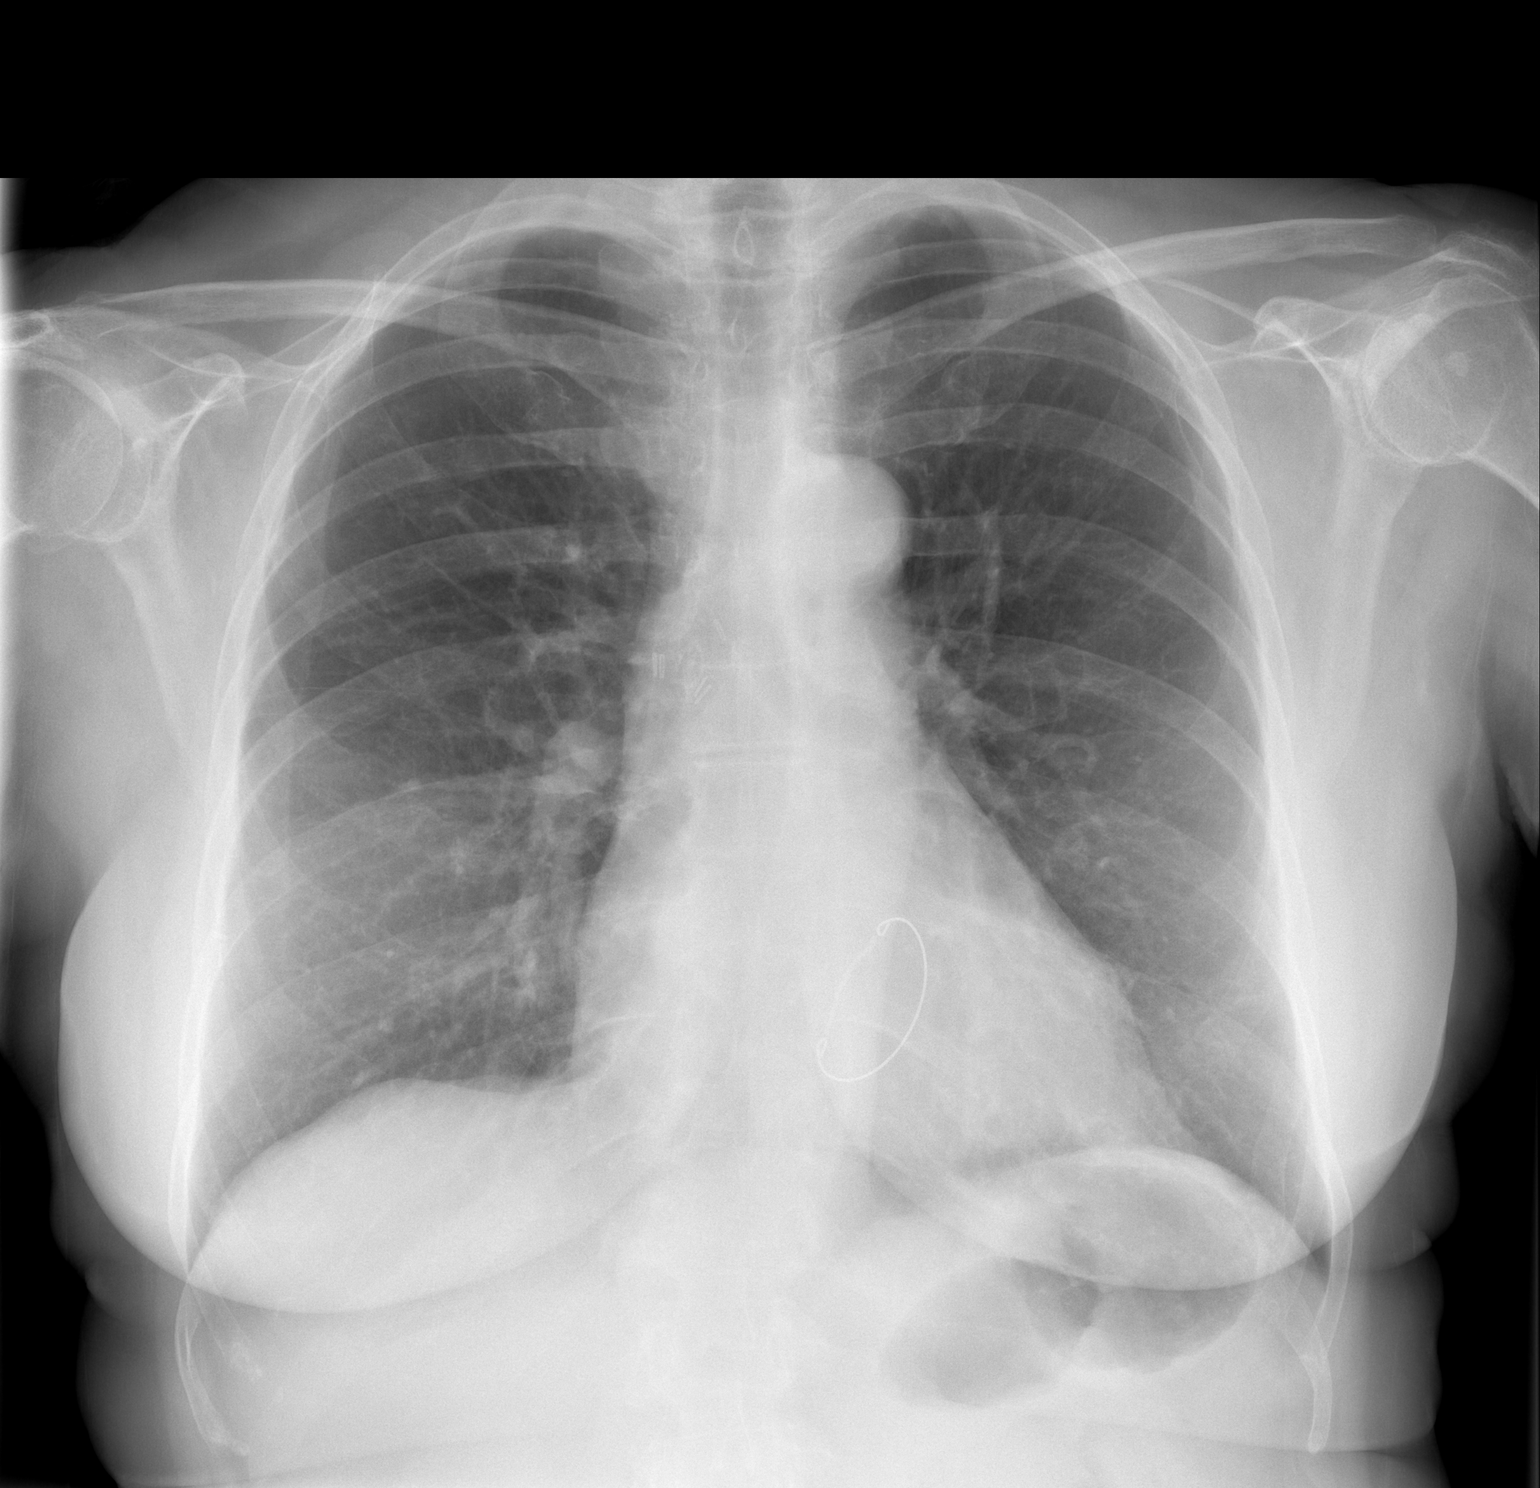

[w chest lat]
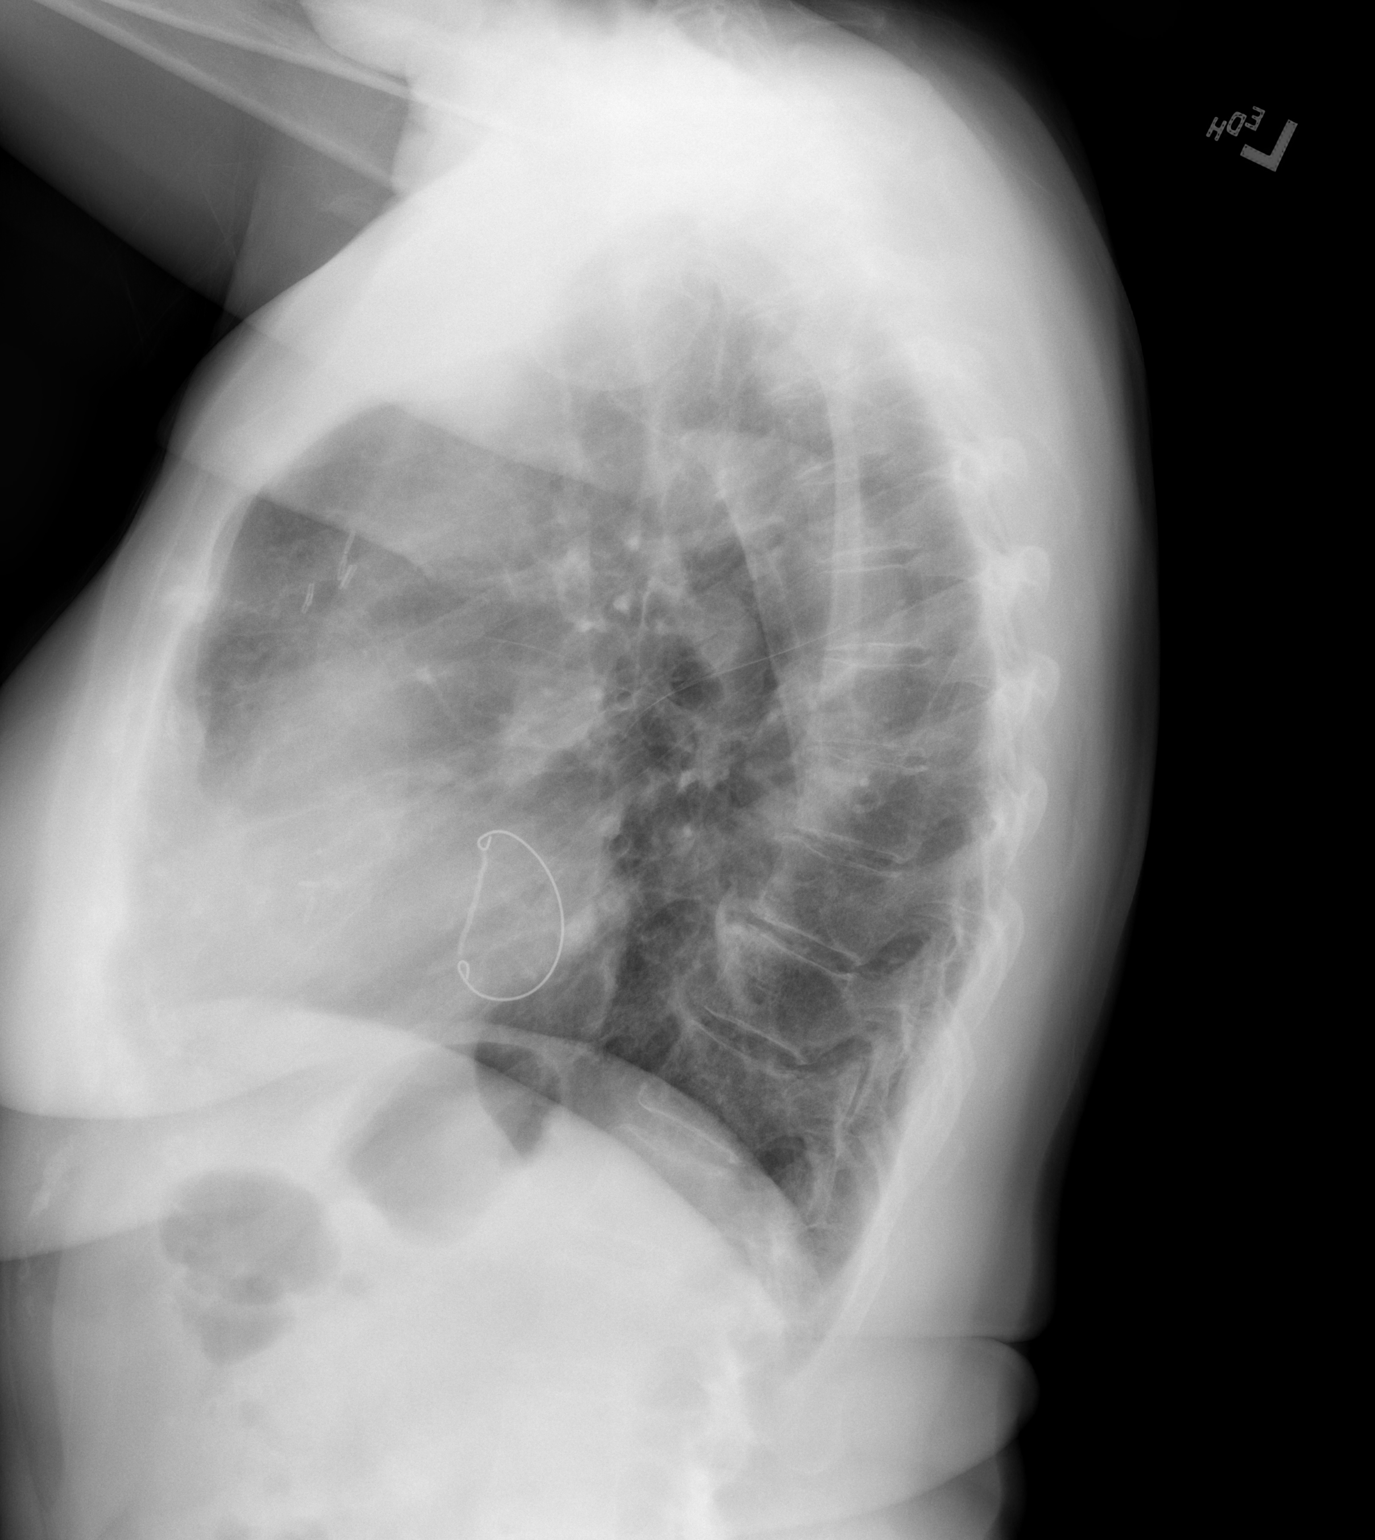

[2 of 2 positions shown; findings below may reference images not displayed]

FINDINGS: Cardiac shadow is stable. Postsurgical changes are again seen. The
lungs are well aerated bilaterally. No focal infiltrate or sizable
effusion is noted. No acute bony abnormality is seen.
IMPRESSION: No active cardiopulmonary disease.

## 2019-07-05 DIAGNOSIS — F419 Anxiety disorder, unspecified: Secondary | ICD-10-CM | POA: Diagnosis not present

## 2019-09-21 ENCOUNTER — Other Ambulatory Visit: Payer: Self-pay | Admitting: Internal Medicine

## 2019-09-21 DIAGNOSIS — Z1231 Encounter for screening mammogram for malignant neoplasm of breast: Secondary | ICD-10-CM

## 2019-11-01 ENCOUNTER — Ambulatory Visit: Payer: BLUE CROSS/BLUE SHIELD

## 2019-12-02 ENCOUNTER — Ambulatory Visit: Payer: BLUE CROSS/BLUE SHIELD

## 2019-12-06 DIAGNOSIS — L219 Seborrheic dermatitis, unspecified: Secondary | ICD-10-CM | POA: Diagnosis not present

## 2020-01-19 ENCOUNTER — Other Ambulatory Visit: Payer: Self-pay

## 2020-01-19 ENCOUNTER — Ambulatory Visit
Admission: RE | Admit: 2020-01-19 | Discharge: 2020-01-19 | Disposition: A | Discharge status: Home / Self Care | Payer: BC Managed Care – PPO | Source: Ambulatory Visit | Attending: Internal Medicine | Admitting: Internal Medicine

## 2020-01-19 DIAGNOSIS — Z1231 Encounter for screening mammogram for malignant neoplasm of breast: Secondary | ICD-10-CM | POA: Diagnosis not present

## 2020-02-01 DIAGNOSIS — L814 Other melanin hyperpigmentation: Secondary | ICD-10-CM | POA: Diagnosis not present

## 2020-02-01 DIAGNOSIS — R208 Other disturbances of skin sensation: Secondary | ICD-10-CM | POA: Diagnosis not present

## 2020-02-01 DIAGNOSIS — D225 Melanocytic nevi of trunk: Secondary | ICD-10-CM | POA: Diagnosis not present

## 2020-02-01 DIAGNOSIS — L738 Other specified follicular disorders: Secondary | ICD-10-CM | POA: Diagnosis not present

## 2020-05-04 DIAGNOSIS — M79662 Pain in left lower leg: Secondary | ICD-10-CM | POA: Diagnosis not present

## 2020-05-24 DIAGNOSIS — M25572 Pain in left ankle and joints of left foot: Secondary | ICD-10-CM | POA: Diagnosis not present

## 2020-05-25 DIAGNOSIS — M6281 Muscle weakness (generalized): Secondary | ICD-10-CM | POA: Diagnosis not present

## 2020-05-25 DIAGNOSIS — R269 Unspecified abnormalities of gait and mobility: Secondary | ICD-10-CM | POA: Diagnosis not present

## 2020-05-30 DIAGNOSIS — R269 Unspecified abnormalities of gait and mobility: Secondary | ICD-10-CM | POA: Diagnosis not present

## 2020-05-30 DIAGNOSIS — M6281 Muscle weakness (generalized): Secondary | ICD-10-CM | POA: Diagnosis not present

## 2020-09-14 DIAGNOSIS — Z20822 Contact with and (suspected) exposure to covid-19: Secondary | ICD-10-CM | POA: Diagnosis not present

## 2020-12-12 ENCOUNTER — Other Ambulatory Visit: Payer: Self-pay | Admitting: Internal Medicine

## 2020-12-12 DIAGNOSIS — Z1231 Encounter for screening mammogram for malignant neoplasm of breast: Secondary | ICD-10-CM

## 2021-01-31 ENCOUNTER — Ambulatory Visit: Payer: BC Managed Care – PPO

## 2021-02-20 ENCOUNTER — Other Ambulatory Visit: Payer: Self-pay

## 2021-02-20 ENCOUNTER — Ambulatory Visit
Admission: RE | Admit: 2021-02-20 | Discharge: 2021-02-20 | Disposition: A | Discharge status: Home / Self Care | Payer: BC Managed Care – PPO | Source: Ambulatory Visit | Attending: Internal Medicine | Admitting: Internal Medicine

## 2021-02-20 DIAGNOSIS — Z1231 Encounter for screening mammogram for malignant neoplasm of breast: Secondary | ICD-10-CM | POA: Diagnosis not present

## 2021-05-11 DIAGNOSIS — Z23 Encounter for immunization: Secondary | ICD-10-CM | POA: Diagnosis not present

## 2021-05-11 DIAGNOSIS — I340 Nonrheumatic mitral (valve) insufficiency: Secondary | ICD-10-CM | POA: Diagnosis not present

## 2021-05-11 DIAGNOSIS — I471 Supraventricular tachycardia: Secondary | ICD-10-CM | POA: Diagnosis not present

## 2021-05-11 DIAGNOSIS — I341 Nonrheumatic mitral (valve) prolapse: Secondary | ICD-10-CM | POA: Diagnosis not present

## 2021-05-11 DIAGNOSIS — F330 Major depressive disorder, recurrent, mild: Secondary | ICD-10-CM | POA: Diagnosis not present

## 2021-05-11 DIAGNOSIS — Z13220 Encounter for screening for lipoid disorders: Secondary | ICD-10-CM | POA: Diagnosis not present

## 2021-06-05 DIAGNOSIS — Z6822 Body mass index (BMI) 22.0-22.9, adult: Secondary | ICD-10-CM | POA: Diagnosis not present

## 2021-06-05 DIAGNOSIS — Z01419 Encounter for gynecological examination (general) (routine) without abnormal findings: Secondary | ICD-10-CM | POA: Diagnosis not present

## 2021-06-05 DIAGNOSIS — Z124 Encounter for screening for malignant neoplasm of cervix: Secondary | ICD-10-CM | POA: Diagnosis not present

## 2021-08-12 ENCOUNTER — Emergency Department (HOSPITAL_COMMUNITY): Payer: BC Managed Care – PPO

## 2021-08-12 ENCOUNTER — Inpatient Hospital Stay (HOSPITAL_COMMUNITY): Payer: BC Managed Care – PPO

## 2021-08-12 ENCOUNTER — Inpatient Hospital Stay (HOSPITAL_COMMUNITY): Payer: BC Managed Care – PPO | Admitting: Anesthesiology

## 2021-08-12 ENCOUNTER — Encounter (HOSPITAL_COMMUNITY): Admission: EM | Disposition: E | Payer: Self-pay | Source: Home / Self Care

## 2021-08-12 ENCOUNTER — Encounter (HOSPITAL_COMMUNITY): Payer: Self-pay

## 2021-08-12 ENCOUNTER — Inpatient Hospital Stay (HOSPITAL_COMMUNITY)
Admission: EM | Admit: 2021-08-12 | Discharge: 2021-08-19 | DRG: 957 | Disposition: E | Payer: BC Managed Care – PPO | Attending: Surgery | Admitting: Surgery

## 2021-08-12 DIAGNOSIS — S14109A Unspecified injury at unspecified level of cervical spinal cord, initial encounter: Secondary | ICD-10-CM | POA: Diagnosis not present

## 2021-08-12 DIAGNOSIS — Z20822 Contact with and (suspected) exposure to covid-19: Secondary | ICD-10-CM | POA: Diagnosis present

## 2021-08-12 DIAGNOSIS — S3991XA Unspecified injury of abdomen, initial encounter: Secondary | ICD-10-CM | POA: Diagnosis not present

## 2021-08-12 DIAGNOSIS — S2243XA Multiple fractures of ribs, bilateral, initial encounter for closed fracture: Secondary | ICD-10-CM | POA: Diagnosis not present

## 2021-08-12 DIAGNOSIS — Z981 Arthrodesis status: Secondary | ICD-10-CM | POA: Diagnosis not present

## 2021-08-12 DIAGNOSIS — S1093XA Contusion of unspecified part of neck, initial encounter: Secondary | ICD-10-CM | POA: Diagnosis present

## 2021-08-12 DIAGNOSIS — R402112 Coma scale, eyes open, never, at arrival to emergency department: Secondary | ICD-10-CM | POA: Diagnosis not present

## 2021-08-12 DIAGNOSIS — R404 Transient alteration of awareness: Secondary | ICD-10-CM | POA: Diagnosis not present

## 2021-08-12 DIAGNOSIS — T884XXA Failed or difficult intubation, initial encounter: Secondary | ICD-10-CM | POA: Diagnosis not present

## 2021-08-12 DIAGNOSIS — S2242XA Multiple fractures of ribs, left side, initial encounter for closed fracture: Secondary | ICD-10-CM | POA: Diagnosis not present

## 2021-08-12 DIAGNOSIS — S065X9A Traumatic subdural hemorrhage with loss of consciousness of unspecified duration, initial encounter: Secondary | ICD-10-CM | POA: Diagnosis not present

## 2021-08-12 DIAGNOSIS — Z66 Do not resuscitate: Secondary | ICD-10-CM | POA: Diagnosis not present

## 2021-08-12 DIAGNOSIS — R402312 Coma scale, best motor response, none, at arrival to emergency department: Secondary | ICD-10-CM | POA: Diagnosis not present

## 2021-08-12 DIAGNOSIS — R402212 Coma scale, best verbal response, none, at arrival to emergency department: Secondary | ICD-10-CM | POA: Diagnosis not present

## 2021-08-12 DIAGNOSIS — E872 Acidosis, unspecified: Secondary | ICD-10-CM | POA: Diagnosis present

## 2021-08-12 DIAGNOSIS — T1490XA Injury, unspecified, initial encounter: Secondary | ICD-10-CM | POA: Diagnosis present

## 2021-08-12 DIAGNOSIS — R0689 Other abnormalities of breathing: Secondary | ICD-10-CM | POA: Diagnosis not present

## 2021-08-12 DIAGNOSIS — R578 Other shock: Secondary | ICD-10-CM | POA: Diagnosis present

## 2021-08-12 DIAGNOSIS — J9690 Respiratory failure, unspecified, unspecified whether with hypoxia or hypercapnia: Secondary | ICD-10-CM

## 2021-08-12 DIAGNOSIS — Y92410 Unspecified street and highway as the place of occurrence of the external cause: Secondary | ICD-10-CM | POA: Diagnosis not present

## 2021-08-12 DIAGNOSIS — Z0189 Encounter for other specified special examinations: Secondary | ICD-10-CM

## 2021-08-12 DIAGNOSIS — S3993XA Unspecified injury of pelvis, initial encounter: Secondary | ICD-10-CM | POA: Diagnosis not present

## 2021-08-12 DIAGNOSIS — J398 Other specified diseases of upper respiratory tract: Secondary | ICD-10-CM | POA: Diagnosis not present

## 2021-08-12 DIAGNOSIS — S15121A Major laceration of right vertebral artery, initial encounter: Secondary | ICD-10-CM | POA: Diagnosis not present

## 2021-08-12 DIAGNOSIS — I517 Cardiomegaly: Secondary | ICD-10-CM | POA: Diagnosis not present

## 2021-08-12 DIAGNOSIS — S12100A Unspecified displaced fracture of second cervical vertebra, initial encounter for closed fracture: Secondary | ICD-10-CM | POA: Diagnosis not present

## 2021-08-12 DIAGNOSIS — Z9911 Dependence on respirator [ventilator] status: Secondary | ICD-10-CM

## 2021-08-12 DIAGNOSIS — S14102A Unspecified injury at C2 level of cervical spinal cord, initial encounter: Secondary | ICD-10-CM | POA: Diagnosis not present

## 2021-08-12 DIAGNOSIS — Z529 Donor of unspecified organ or tissue: Secondary | ICD-10-CM | POA: Diagnosis not present

## 2021-08-12 DIAGNOSIS — S15191A Other specified injury of right vertebral artery, initial encounter: Secondary | ICD-10-CM | POA: Diagnosis not present

## 2021-08-12 DIAGNOSIS — Z515 Encounter for palliative care: Secondary | ICD-10-CM | POA: Diagnosis not present

## 2021-08-12 DIAGNOSIS — R0603 Acute respiratory distress: Secondary | ICD-10-CM | POA: Diagnosis not present

## 2021-08-12 DIAGNOSIS — K5730 Diverticulosis of large intestine without perforation or abscess without bleeding: Secondary | ICD-10-CM | POA: Diagnosis not present

## 2021-08-12 DIAGNOSIS — J9600 Acute respiratory failure, unspecified whether with hypoxia or hypercapnia: Secondary | ICD-10-CM | POA: Diagnosis not present

## 2021-08-12 DIAGNOSIS — G8250 Quadriplegia, unspecified: Secondary | ICD-10-CM | POA: Diagnosis present

## 2021-08-12 DIAGNOSIS — I771 Stricture of artery: Secondary | ICD-10-CM | POA: Diagnosis not present

## 2021-08-12 DIAGNOSIS — S15119A Minor laceration of unspecified vertebral artery, initial encounter: Secondary | ICD-10-CM

## 2021-08-12 DIAGNOSIS — S12190A Other displaced fracture of second cervical vertebra, initial encounter for closed fracture: Secondary | ICD-10-CM

## 2021-08-12 DIAGNOSIS — J90 Pleural effusion, not elsewhere classified: Secondary | ICD-10-CM | POA: Diagnosis not present

## 2021-08-12 DIAGNOSIS — S06360A Traumatic hemorrhage of cerebrum, unspecified, without loss of consciousness, initial encounter: Secondary | ICD-10-CM | POA: Diagnosis not present

## 2021-08-12 DIAGNOSIS — S15102A Unspecified injury of left vertebral artery, initial encounter: Secondary | ICD-10-CM | POA: Diagnosis not present

## 2021-08-12 DIAGNOSIS — S129XXA Fracture of neck, unspecified, initial encounter: Secondary | ICD-10-CM | POA: Diagnosis present

## 2021-08-12 DIAGNOSIS — J189 Pneumonia, unspecified organism: Secondary | ICD-10-CM | POA: Diagnosis not present

## 2021-08-12 DIAGNOSIS — Z4682 Encounter for fitting and adjustment of non-vascular catheter: Secondary | ICD-10-CM | POA: Diagnosis not present

## 2021-08-12 DIAGNOSIS — F419 Anxiety disorder, unspecified: Secondary | ICD-10-CM | POA: Diagnosis not present

## 2021-08-12 DIAGNOSIS — N051 Unspecified nephritic syndrome with focal and segmental glomerular lesions: Secondary | ICD-10-CM | POA: Diagnosis not present

## 2021-08-12 DIAGNOSIS — S301XXA Contusion of abdominal wall, initial encounter: Secondary | ICD-10-CM | POA: Diagnosis not present

## 2021-08-12 DIAGNOSIS — Z978 Presence of other specified devices: Secondary | ICD-10-CM

## 2021-08-12 DIAGNOSIS — Z452 Encounter for adjustment and management of vascular access device: Secondary | ICD-10-CM | POA: Diagnosis not present

## 2021-08-12 DIAGNOSIS — I499 Cardiac arrhythmia, unspecified: Secondary | ICD-10-CM | POA: Diagnosis not present

## 2021-08-12 DIAGNOSIS — Z041 Encounter for examination and observation following transport accident: Secondary | ICD-10-CM | POA: Diagnosis not present

## 2021-08-12 DIAGNOSIS — S12101A Unspecified nondisplaced fracture of second cervical vertebra, initial encounter for closed fracture: Secondary | ICD-10-CM | POA: Diagnosis not present

## 2021-08-12 DIAGNOSIS — K3189 Other diseases of stomach and duodenum: Secondary | ICD-10-CM | POA: Diagnosis not present

## 2021-08-12 DIAGNOSIS — I469 Cardiac arrest, cause unspecified: Secondary | ICD-10-CM | POA: Diagnosis not present

## 2021-08-12 HISTORY — PX: IR ANGIOGRAM FOLLOW UP STUDY: IMG697

## 2021-08-12 HISTORY — PX: IR ANGIO VERTEBRAL SEL VERTEBRAL BILAT MOD SED: IMG5369

## 2021-08-12 HISTORY — PX: RADIOLOGY WITH ANESTHESIA: SHX6223

## 2021-08-12 HISTORY — PX: IR TRANSCATH/EMBOLIZ: IMG695

## 2021-08-12 LAB — URINALYSIS, MICROSCOPIC (REFLEX): Squamous Epithelial / HPF: NONE SEEN (ref 0–5)

## 2021-08-12 LAB — POCT I-STAT 7, (LYTES, BLD GAS, ICA,H+H)
Acid-base deficit: 7 mmol/L — ABNORMAL HIGH (ref 0.0–2.0)
Acid-base deficit: 7 mmol/L — ABNORMAL HIGH (ref 0.0–2.0)
Acid-base deficit: 3 mmol/L — ABNORMAL HIGH (ref 0.0–2.0)
Bicarbonate: 19.2 mmol/L — ABNORMAL LOW (ref 20.0–28.0)
Bicarbonate: 19.8 mmol/L — ABNORMAL LOW (ref 20.0–28.0)
Bicarbonate: 22 mmol/L (ref 20.0–28.0)
Calcium, Ion: 1.06 mmol/L — ABNORMAL LOW (ref 1.15–1.40)
Calcium, Ion: 0.99 mmol/L — ABNORMAL LOW (ref 1.15–1.40)
Calcium, Ion: 0.94 mmol/L — ABNORMAL LOW (ref 1.15–1.40)
HCT: 31 % — ABNORMAL LOW (ref 36.0–46.0)
HCT: 26 % — ABNORMAL LOW (ref 36.0–46.0)
HCT: 29 % — ABNORMAL LOW (ref 36.0–46.0)
Hemoglobin: 10.5 g/dL — ABNORMAL LOW (ref 12.0–15.0)
Hemoglobin: 8.8 g/dL — ABNORMAL LOW (ref 12.0–15.0)
Hemoglobin: 9.9 g/dL — ABNORMAL LOW (ref 12.0–15.0)
O2 Saturation: 100 %
O2 Saturation: 99 %
O2 Saturation: 100 %
Patient temperature: 35.9
Patient temperature: 35.3
Patient temperature: 97
Potassium: 3.1 mmol/L — ABNORMAL LOW (ref 3.5–5.1)
Potassium: 2.9 mmol/L — ABNORMAL LOW (ref 3.5–5.1)
Potassium: 3.1 mmol/L — ABNORMAL LOW (ref 3.5–5.1)
Sodium: 143 mmol/L (ref 135–145)
Sodium: 144 mmol/L (ref 135–145)
Sodium: 142 mmol/L (ref 135–145)
TCO2: 20 mmol/L — ABNORMAL LOW (ref 22–32)
TCO2: 21 mmol/L — ABNORMAL LOW (ref 22–32)
TCO2: 23 mmol/L (ref 22–32)
pCO2 arterial: 39.3 mmHg (ref 32.0–48.0)
pCO2 arterial: 39.2 mmHg (ref 32.0–48.0)
pCO2 arterial: 37.7 mmHg (ref 32.0–48.0)
pH, Arterial: 7.291 — ABNORMAL LOW (ref 7.350–7.450)
pH, Arterial: 7.302 — ABNORMAL LOW (ref 7.350–7.450)
pH, Arterial: 7.371 (ref 7.350–7.450)
pO2, Arterial: 258 mmHg — ABNORMAL HIGH (ref 83.0–108.0)
pO2, Arterial: 128 mmHg — ABNORMAL HIGH (ref 83.0–108.0)
pO2, Arterial: 380 mmHg — ABNORMAL HIGH (ref 83.0–108.0)

## 2021-08-12 LAB — COMPREHENSIVE METABOLIC PANEL
ALT: 261 U/L — ABNORMAL HIGH (ref 0–44)
AST: 502 U/L — ABNORMAL HIGH (ref 15–41)
Albumin: 3.4 g/dL — ABNORMAL LOW (ref 3.5–5.0)
Alkaline Phosphatase: 121 U/L (ref 38–126)
Anion gap: 13 (ref 5–15)
BUN: 13 mg/dL (ref 8–23)
CO2: 18 mmol/L — ABNORMAL LOW (ref 22–32)
Calcium: 8.3 mg/dL — ABNORMAL LOW (ref 8.9–10.3)
Chloride: 110 mmol/L (ref 98–111)
Creatinine, Ser: 0.73 mg/dL (ref 0.44–1.00)
GFR, Estimated: >60 mL/min (ref >60)
Glucose, Bld: 146 mg/dL — ABNORMAL HIGH (ref 70–99)
Potassium: 3.1 mmol/L — ABNORMAL LOW (ref 3.5–5.1)
Sodium: 141 mmol/L (ref 135–145)
Total Bilirubin: 0.8 mg/dL (ref 0.3–1.2)
Total Protein: 6.2 g/dL — ABNORMAL LOW (ref 6.5–8.1)

## 2021-08-12 LAB — TRAUMA TEG PANEL
CFF Max Amplitude: 16.2 mm (ref 15–32)
Citrated Kaolin (R): 5 min (ref 4.6–9.1)
Citrated Rapid TEG (MA): 50.3 mm — ABNORMAL LOW (ref 52–70)
Lysis at 30 Minutes: 0 % (ref 0.0–2.6)

## 2021-08-12 LAB — I-STAT CHEM 8, ED
BUN: 14 mg/dL (ref 8–23)
Calcium, Ion: 1.05 mmol/L — ABNORMAL LOW (ref 1.15–1.40)
Chloride: 110 mmol/L (ref 98–111)
Creatinine, Ser: 0.8 mg/dL (ref 0.44–1.00)
Glucose, Bld: 137 mg/dL — ABNORMAL HIGH (ref 70–99)
HCT: 40 % (ref 36.0–46.0)
Hemoglobin: 13.6 g/dL (ref 12.0–15.0)
Potassium: 3.1 mmol/L — ABNORMAL LOW (ref 3.5–5.1)
Sodium: 142 mmol/L (ref 135–145)
TCO2: 19 mmol/L — ABNORMAL LOW (ref 22–32)

## 2021-08-12 LAB — URINALYSIS, ROUTINE W REFLEX MICROSCOPIC
Bilirubin Urine: NEGATIVE
Glucose, UA: NEGATIVE mg/dL
Ketones, ur: NEGATIVE mg/dL
Leukocytes,Ua: NEGATIVE
Nitrite: NEGATIVE
Protein, ur: NEGATIVE mg/dL
Specific Gravity, Urine: 1.015 (ref 1.005–1.030)
pH: 5.5 (ref 5.0–8.0)

## 2021-08-12 LAB — PREPARE CRYOPRECIPITATE: Unit division: 0

## 2021-08-12 LAB — I-STAT VENOUS BLOOD GAS, ED
Acid-base deficit: 10 mmol/L — ABNORMAL HIGH (ref 0.0–2.0)
Bicarbonate: 17.6 mmol/L — ABNORMAL LOW (ref 20.0–28.0)
Calcium, Ion: 1.06 mmol/L — ABNORMAL LOW (ref 1.15–1.40)
HCT: 38 % (ref 36.0–46.0)
Hemoglobin: 12.9 g/dL (ref 12.0–15.0)
O2 Saturation: 94 %
Potassium: 3.2 mmol/L — ABNORMAL LOW (ref 3.5–5.1)
Sodium: 142 mmol/L (ref 135–145)
TCO2: 19 mmol/L — ABNORMAL LOW (ref 22–32)
pCO2, Ven: 43.5 mmHg — ABNORMAL LOW (ref 44.0–60.0)
pH, Ven: 7.216 — ABNORMAL LOW (ref 7.250–7.430)
pO2, Ven: 83 mmHg — ABNORMAL HIGH (ref 32.0–45.0)

## 2021-08-12 LAB — TROPONIN I (HIGH SENSITIVITY)
Troponin I (High Sensitivity): 80 ng/L — ABNORMAL HIGH (ref <18)
Troponin I (High Sensitivity): 442 ng/L (ref <18)

## 2021-08-12 LAB — RESP PANEL BY RT-PCR (FLU A&B, COVID) ARPGX2
Influenza A by PCR: NEGATIVE
Influenza B by PCR: NEGATIVE
SARS Coronavirus 2 by RT PCR: NEGATIVE

## 2021-08-12 LAB — PREPARE PLATELET PHERESIS: Unit division: 0

## 2021-08-12 LAB — CBC
HCT: 37.7 % (ref 36.0–46.0)
Hemoglobin: 12.4 g/dL (ref 12.0–15.0)
MCH: 35.1 pg — ABNORMAL HIGH (ref 26.0–34.0)
MCHC: 32.9 g/dL (ref 30.0–36.0)
MCV: 106.8 fL — ABNORMAL HIGH (ref 80.0–100.0)
Platelets: 137 10*3/uL — ABNORMAL LOW (ref 150–400)
RBC: 3.53 MIL/uL — ABNORMAL LOW (ref 3.87–5.11)
RDW: 14.6 % (ref 11.5–15.5)
WBC: 5.3 10*3/uL (ref 4.0–10.5)
nRBC: 0.4 % — ABNORMAL HIGH (ref 0.0–0.2)

## 2021-08-12 LAB — BPAM PLATELET PHERESIS
Blood Product Expiration Date: 202212262359
ISSUE DATE / TIME: 202212251038
Unit Type and Rh: 5100

## 2021-08-12 LAB — PROTIME-INR
INR: 1.2 (ref 0.8–1.2)
Prothrombin Time: 15 seconds (ref 11.4–15.2)

## 2021-08-12 LAB — BPAM CRYOPRECIPITATE
Blood Product Expiration Date: 202212251655
ISSUE DATE / TIME: 202212251111
Unit Type and Rh: 5100

## 2021-08-12 LAB — ETHANOL: Alcohol, Ethyl (B): 102 mg/dL — ABNORMAL HIGH (ref <10)

## 2021-08-12 LAB — CDS SEROLOGY

## 2021-08-12 LAB — ABO/RH: ABO/RH(D): O POS

## 2021-08-12 LAB — MRSA NEXT GEN BY PCR, NASAL: MRSA by PCR Next Gen: NOT DETECTED

## 2021-08-12 LAB — LACTIC ACID, PLASMA: Lactic Acid, Venous: 4.9 mmol/L (ref 0.5–1.9)

## 2021-08-12 LAB — HIV ANTIBODY (ROUTINE TESTING W REFLEX): HIV Screen 4th Generation wRfx: NONREACTIVE

## 2021-08-12 SURGERY — ANTERIOR CERVICAL DECOMPRESSION/DISCECTOMY FUSION 1 LEVEL
Anesthesia: General

## 2021-08-12 SURGERY — IR WITH ANESTHESIA
Anesthesia: General

## 2021-08-12 MED ORDER — FENTANYL CITRATE (PF) 100 MCG/2ML IJ SOLN: 50.0000 ug | Freq: Once | INTRAMUSCULAR | Status: DC

## 2021-08-12 MED ORDER — CHLORHEXIDINE GLUCONATE 0.12% ORAL RINSE (MEDLINE KIT)
15.0000 mL | Freq: Two times a day (BID) | OROMUCOSAL | Status: DC
  Administered 2021-08-12 – 2021-08-16 (×8): 15 mL via OROMUCOSAL

## 2021-08-12 MED ORDER — ONDANSETRON HCL 4 MG/5ML PO SOLN
4.0000 mg | Freq: Four times a day (QID) | ORAL | Status: DC | PRN
  Filled 2021-08-12: qty 5

## 2021-08-12 MED ORDER — FENTANYL CITRATE (PF) 100 MCG/2ML IJ SOLN
INTRAMUSCULAR | Status: AC
  Filled 2021-08-12: qty 2

## 2021-08-12 MED ORDER — ORAL CARE MOUTH RINSE
15.0000 mL | OROMUCOSAL | Status: DC
  Administered 2021-08-12 – 2021-08-16 (×41): 15 mL via OROMUCOSAL

## 2021-08-12 MED ORDER — LEVETIRACETAM IN NACL 500 MG/100ML IV SOLN
500.0000 mg | Freq: Two times a day (BID) | INTRAVENOUS | Status: DC
  Administered 2021-08-12 – 2021-08-16 (×9): 500 mg via INTRAVENOUS
  Filled 2021-08-12 (×9): qty 100

## 2021-08-12 MED ORDER — OXYCODONE HCL 5 MG PO TABS: 5.0000 mg | ORAL_TABLET | ORAL | Status: DC | PRN

## 2021-08-12 MED ORDER — FENTANYL 2500MCG IN NS 250ML (10MCG/ML) PREMIX INFUSION
50.0000 ug/h | INTRAVENOUS | Status: DC
Start: 2021-08-12 — End: 2021-08-16
  Administered 2021-08-12 – 2021-08-13 (×2): 50 ug/h via INTRAVENOUS
  Filled 2021-08-12 (×2): qty 250

## 2021-08-12 MED ORDER — FENTANYL BOLUS VIA INFUSION
50.0000 ug | INTRAVENOUS | Status: DC | PRN
Start: 2021-08-12 — End: 2021-08-17
  Filled 2021-08-12: qty 100

## 2021-08-12 MED ORDER — SODIUM CHLORIDE 0.9 % IV BOLUS
1000.0000 mL | Freq: Once | INTRAVENOUS | Status: AC
  Administered 2021-08-12: 09:00:00 1000 mL via INTRAVENOUS

## 2021-08-12 MED ORDER — IOHEXOL 300 MG/ML  SOLN
100.0000 mL | Freq: Once | INTRAMUSCULAR | Status: AC | PRN
  Administered 2021-08-12: 09:00:00 100 mL via INTRAVENOUS

## 2021-08-12 MED ORDER — FENTANYL CITRATE PF 50 MCG/ML IJ SOSY
50.0000 ug | PREFILLED_SYRINGE | Freq: Once | INTRAMUSCULAR | Status: AC
  Administered 2021-08-12: 11:00:00 50 ug via INTRAVENOUS
  Filled 2021-08-12: qty 1

## 2021-08-12 MED ORDER — ACETAMINOPHEN 500 MG PO TABS: 1000.0000 mg | ORAL_TABLET | Freq: Four times a day (QID) | ORAL | Status: DC

## 2021-08-12 MED ORDER — NOREPINEPHRINE 4 MG/250ML-% IV SOLN
0.0000 ug/min | INTRAVENOUS | Status: DC
  Administered 2021-08-12: 17:00:00 10 ug/min via INTRAVENOUS
  Administered 2021-08-13: 12:00:00 20 ug/min via INTRAVENOUS
  Administered 2021-08-13 (×3): 16 ug/min via INTRAVENOUS
  Administered 2021-08-13: 08:00:00 6 ug/min via INTRAVENOUS
  Administered 2021-08-14: 12:00:00 12 ug/min via INTRAVENOUS
  Administered 2021-08-14: 23:00:00 17 ug/min via INTRAVENOUS
  Administered 2021-08-14: 03:00:00 16 ug/min via INTRAVENOUS
  Administered 2021-08-14 (×2): 14 ug/min via INTRAVENOUS
  Administered 2021-08-15: 21:00:00 22 ug/min via INTRAVENOUS
  Administered 2021-08-15: 03:00:00 17 ug/min via INTRAVENOUS
  Administered 2021-08-15: 18:00:00 25 ug/min via INTRAVENOUS
  Administered 2021-08-15 (×3): 17 ug/min via INTRAVENOUS
  Administered 2021-08-16: 09:00:00 7 ug/min via INTRAVENOUS
  Administered 2021-08-16: 01:00:00 22 ug/min via INTRAVENOUS
  Administered 2021-08-16: 04:00:00 14 ug/min via INTRAVENOUS
  Filled 2021-08-12 (×10): qty 250
  Filled 2021-08-12: qty 500
  Filled 2021-08-12 (×6): qty 250
  Filled 2021-08-12: qty 500
  Filled 2021-08-12 (×2): qty 250

## 2021-08-12 MED ORDER — PROPOFOL 1000 MG/100ML IV EMUL
0.0000 ug/kg/min | INTRAVENOUS | Status: DC
  Administered 2021-08-12: 16:00:00 30 ug/kg/min via INTRAVENOUS
  Filled 2021-08-12 (×2): qty 100

## 2021-08-12 MED ORDER — SODIUM CHLORIDE 0.9 % IV SOLN: INTRAVENOUS | Status: DC | PRN

## 2021-08-12 MED ORDER — NOREPINEPHRINE 4 MG/250ML-% IV SOLN: 0.0000 ug/min | INTRAVENOUS | Status: DC

## 2021-08-12 MED ORDER — IOHEXOL 300 MG/ML  SOLN
100.0000 mL | Freq: Once | INTRAMUSCULAR | Status: AC | PRN
  Administered 2021-08-12: 14:00:00 80 mL via INTRA_ARTERIAL

## 2021-08-12 MED ORDER — ONDANSETRON 4 MG PO TBDP: 4.0000 mg | ORAL_TABLET | Freq: Four times a day (QID) | ORAL | Status: DC | PRN

## 2021-08-12 MED ORDER — ONDANSETRON HCL 4 MG/2ML IJ SOLN: 4.0000 mg | Freq: Four times a day (QID) | INTRAMUSCULAR | Status: DC | PRN

## 2021-08-12 MED ORDER — POLYETHYLENE GLYCOL 3350 17 G PO PACK
17.0000 g | PACK | Freq: Every day | ORAL | Status: DC
  Filled 2021-08-12: qty 1

## 2021-08-12 MED ORDER — FENTANYL BOLUS VIA INFUSION
50.0000 ug | INTRAVENOUS | Status: DC | PRN
  Filled 2021-08-12: qty 100

## 2021-08-12 MED ORDER — CHLORHEXIDINE GLUCONATE CLOTH 2 % EX PADS
6.0000 | MEDICATED_PAD | Freq: Every day | CUTANEOUS | Status: DC
  Administered 2021-08-12 – 2021-08-16 (×4): 6 via TOPICAL

## 2021-08-12 MED ORDER — LIDOCAINE 2% (20 MG/ML) 5 ML SYRINGE
INTRAMUSCULAR | Status: AC
  Filled 2021-08-12: qty 5

## 2021-08-12 MED ORDER — DOCUSATE SODIUM 100 MG PO CAPS: 100.0000 mg | ORAL_CAPSULE | Freq: Two times a day (BID) | ORAL | Status: DC

## 2021-08-12 MED ORDER — SODIUM CHLORIDE 0.9 % IV SOLN: 250.0000 mL | INTRAVENOUS | Status: DC

## 2021-08-12 MED ORDER — FENTANYL 2500MCG IN NS 250ML (10MCG/ML) PREMIX INFUSION
50.0000 ug/h | INTRAVENOUS | Status: DC
  Administered 2021-08-12: 11:00:00 50 ug/h via INTRAVENOUS
  Filled 2021-08-12: qty 250

## 2021-08-12 MED ORDER — DOCUSATE SODIUM 50 MG/5ML PO LIQD
100.0000 mg | Freq: Two times a day (BID) | ORAL | Status: DC
  Filled 2021-08-12 (×3): qty 10

## 2021-08-12 MED ORDER — ROCURONIUM BROMIDE 10 MG/ML (PF) SYRINGE
PREFILLED_SYRINGE | INTRAVENOUS | Status: DC | PRN
  Administered 2021-08-12 (×3): 50 mg via INTRAVENOUS

## 2021-08-12 MED ORDER — NOREPINEPHRINE 4 MG/250ML-% IV SOLN
2.0000 ug/min | INTRAVENOUS | Status: DC
  Administered 2021-08-12: 09:00:00 5 ug/min via INTRAVENOUS

## 2021-08-12 MED ORDER — IOHEXOL 300 MG/ML  SOLN
100.0000 mL | Freq: Once | INTRAMUSCULAR | Status: AC | PRN
  Administered 2021-08-12: 14:00:00 30 mL via INTRA_ARTERIAL

## 2021-08-12 MED ORDER — SODIUM CHLORIDE 0.9 % IV SOLN: INTRAVENOUS | Status: DC

## 2021-08-12 MED ORDER — ONDANSETRON HCL 4 MG/2ML IJ SOLN
INTRAMUSCULAR | Status: DC | PRN
  Administered 2021-08-12: 4 mg via INTRAVENOUS

## 2021-08-12 MED ORDER — DEXAMETHASONE SODIUM PHOSPHATE 10 MG/ML IJ SOLN
INTRAMUSCULAR | Status: DC | PRN
  Administered 2021-08-12: 10 mg via INTRAVENOUS

## 2021-08-12 MED ORDER — LACTATED RINGERS IV SOLN: INTRAVENOUS | Status: DC | PRN

## 2021-08-12 MED ORDER — PROPOFOL 10 MG/ML IV BOLUS
INTRAVENOUS | Status: DC | PRN
  Administered 2021-08-12: 20 mg via INTRAVENOUS

## 2021-08-12 MED ORDER — PROPOFOL 10 MG/ML IV BOLUS
INTRAVENOUS | Status: AC
  Filled 2021-08-12: qty 20

## 2021-08-12 MED ORDER — MORPHINE SULFATE (PF) 4 MG/ML IV SOLN
4.0000 mg | INTRAVENOUS | Status: DC | PRN
  Filled 2021-08-12 (×2): qty 1

## 2021-08-12 MED ORDER — ALBUMIN HUMAN 5 % IV SOLN: INTRAVENOUS | Status: DC | PRN

## 2021-08-12 NOTE — ED Notes (Signed)
ED team transported per Dr. Bedelia Person to OR. On arrival OR stated they were unaware of pt coming, team and room not prepared. Per OR, pt was to goto IR.   For pt safety, ED team remained with pt and returned to Trauma B.  Dr. Bedelia Person made aware.

## 2021-08-12 NOTE — ED Notes (Signed)
Delay to CT d/t pt unstable
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
## 2021-08-12 NOTE — ED Notes (Signed)
Intubation complete.

## 2021-08-12 NOTE — Brief Op Note (Signed)
°  NEUROSURGERY BRIEF OPERATIVE  NOTE   PREOP DX: Right vertebral artery transection  POSTOP DX: Same  PROCEDURE: Endovascular ligation of right vertebral artery  SURGEON: Dr. Lisbeth Renshaw, MD  ANESTHESIA: GETA  EBL: Minimal  SPECIMENS: None  COMPLICATIONS: None  CONDITION: Stable to ICU  FINDINGS (Full report in [facility]): 1. Successful endovascular ligation of the proximal right vertebral artery after traumatic transection. Unable to obtain access to the distal right VA for distal control however no contrast extravasation was seen.   Lisbeth Renshaw, MD [facility]

## 2021-08-12 NOTE — Progress Notes (Signed)
65 y.o. 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 She has active extravasation and a tense neck. Will plan to explore her neck emergently with Dr. Franky Macho.

## 2021-08-12 NOTE — ED Notes (Signed)
Patient transported to CT 

## 2021-08-12 NOTE — ED Notes (Addendum)
Delay to CT d/t pt hemodynamically unstable

## 2021-08-12 NOTE — Sedation Documentation (Signed)
Transported to 4N20 with respiratory therapist and CRNA. Dressing and pulses checked.
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
## 2021-08-12 NOTE — Transfer of Care (Signed)
Immediate Anesthesia Transfer of Care Note  Patient: April Robertson  Procedure(s) Performed: ANTERIOR CERVICAL DECOMPRESSION/DISCECTOMY FUSION 1 LEVEL IR WITH ANESTHESIA  Patient Location: ICU  Anesthesia Type:General  Level of Consciousness: sedated, comatose and Patient remains intubated per anesthesia plan  Airway & Oxygen Therapy: Patient remains intubated per anesthesia plan, Patient placed on Ventilator (see vital sign flow sheet for setting) and report to bedside RN  Post-op Assessment: Report given to RN and Post -op Vital signs reviewed and stable  Post vital signs: Reviewed  Last Vitals:  Vitals Value Taken Time  BP 138/85 2021/08/19 1434  Temp 36.1 C August 19, 2021 1433  Pulse 60 08-19-21 1446  Resp 11 Aug 19, 2021 1446  SpO2 100 % 19-Aug-2021 1446  Vitals shown include unvalidated device data.  Last Pain:  Vitals:   08-19-2021 1433  TempSrc: Axillary  PainSc:          Complications: No notable events documented.

## 2021-08-12 NOTE — ED Notes (Signed)
iStat results given to Dr. Bedelia Person
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
## 2021-08-12 NOTE — Progress Notes (Signed)
CRITICAL VALUE STICKER  CRITICAL VALUE: troponin 442  RECEIVER (on-site recipient of call): Leafy Ro RN  DATE & TIME NOTIFIED: September 08, 2021 @ 1643  MESSENGER (representative from lab):  MD NOTIFIED: Kris Mouton MD  TIME OF NOTIFICATION: 1715 September 08, 2021  RESPONSE:  No new orders
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
## 2021-08-12 NOTE — Anesthesia Preprocedure Evaluation (Addendum)
Anesthesia Evaluation  Patient identified by MRN, date of birth, ID band Patient unresponsive    Reviewed: Allergy & Precautions, H&P , NPO status , Patient's Chart, lab work & pertinent test resultsPreop documentation limited or incomplete due to emergent nature of procedure.  Airway Mallampati: Intubated       Dental no notable dental hx. (+) Teeth Intact, Dental Advisory Given   Pulmonary neg pulmonary ROS,    Pulmonary exam normal breath sounds clear to auscultation       Cardiovascular negative cardio ROS   Rhythm:Regular Rate:Normal     Neuro/Psych Code Stroke negative psych ROS   GI/Hepatic negative GI ROS, Neg liver ROS,   Endo/Other  negative endocrine ROS  Renal/GU negative Renal ROS  negative genitourinary   Musculoskeletal   Abdominal   Peds  Hematology negative hematology ROS (+)   Anesthesia Other Findings   Reproductive/Obstetrics negative OB ROS                            Anesthesia Physical Anesthesia Plan  ASA: 4 and emergent  Anesthesia Plan: General   Post-op Pain Management:    Induction: Intravenous  PONV Risk Score and Plan: 3 and Treatment may vary due to age or medical condition  Airway Management Planned: Oral ETT  Additional Equipment: Arterial line  Intra-op Plan:   Post-operative Plan: Post-operative intubation/ventilation  Informed Consent: I have reviewed the patients History and Physical, chart, labs and discussed the procedure including the risks, benefits and alternatives for the proposed anesthesia with the patient or authorized representative who has indicated his/her understanding and acceptance.     Dental advisory given  Plan Discussed with: CRNA, Anesthesiologist and Surgeon  Anesthesia Plan Comments:        Anesthesia Quick Evaluation

## 2021-08-12 NOTE — Anesthesia Procedure Notes (Signed)
Arterial Line Insertion Start/End01-12-2021 11:30 AM, 08/23/2021 11:35 AM Performed by: Achille Rich, MD  Patient location: Pre-op. Preanesthetic checklist: patient identified, IV checked, site marked, risks and benefits discussed, surgical consent, monitors and equipment checked, pre-op evaluation, timeout performed and anesthesia consent Lidocaine 1% used for infiltration Left, radial was placed Catheter size: 20 G Hand hygiene performed  and maximum sterile barriers used   Attempts: 1 Procedure performed using ultrasound guided technique. Ultrasound Notes:anatomy identified, needle tip was noted to be adjacent to the nerve/plexus identified and no ultrasound evidence of intravascular and/or intraneural injection Following insertion, dressing applied and Biopatch. Post procedure assessment: normal and unchanged
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
## 2021-08-12 NOTE — Anesthesia Procedure Notes (Addendum)
Procedure Name: Intubation Date/Time: 08-15-21 11:17 AM Performed by: Lovie Chol, CRNA Pre-anesthesia Checklist: Patient identified, Emergency Drugs available, Suction available and Patient being monitored Patient Re-evaluated:Patient Re-evaluated prior to induction Oxygen Delivery Method: Circle System Utilized Preoxygenation: Pre-oxygenation with 100% oxygen Induction Type: Inhalational induction with existing ETT Tube type: Oral Tube size: 7.5 mm Number of attempts: 1 Placement Confirmation: ETT inserted through vocal cords under direct vision, positive ETCO2 and breath sounds checked- equal and bilateral Secured at: 23 cm Tube secured with: Tape Dental Injury: Teeth and Oropharynx as per pre-operative assessment
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
## 2021-08-12 NOTE — Progress Notes (Signed)
Orthopedic Tech Progress Note Patient Details:  April Robertson 09/13/1955 [id number]  Level 1 Trauma  Patient ID: [name], female   DOB: 08/18/1956, 65 y.o.   MRN: [id number]  [name] 2021-09-06, 10:21 AM

## 2021-08-13 ENCOUNTER — Encounter (HOSPITAL_COMMUNITY): Payer: Self-pay | Admitting: Radiology

## 2021-08-13 ENCOUNTER — Inpatient Hospital Stay (HOSPITAL_COMMUNITY): Payer: BC Managed Care – PPO

## 2021-08-13 DIAGNOSIS — T1490XA Injury, unspecified, initial encounter: Secondary | ICD-10-CM

## 2021-08-13 DIAGNOSIS — Z515 Encounter for palliative care: Secondary | ICD-10-CM

## 2021-08-13 LAB — PREPARE FRESH FROZEN PLASMA
Unit division: 0
Unit division: 0
Unit division: 0
Unit division: 0
Unit division: 0
Unit division: 0
Unit division: 0
Unit division: 0
Unit division: 0
Unit division: 0
Unit division: 0
Unit division: 0

## 2021-08-13 LAB — BPAM FFP
Blood Product Expiration Date: 202212262359
Blood Product Expiration Date: 202212262359
Blood Product Expiration Date: 202212262359
Blood Product Expiration Date: 202212272359
Blood Product Expiration Date: 202212272359
Blood Product Expiration Date: 202212272359
Blood Product Expiration Date: 202212282359
Blood Product Expiration Date: 202212302359
Blood Product Expiration Date: 202212302359
Blood Product Expiration Date: 202212302359
Blood Product Expiration Date: 202212302359
Blood Product Expiration Date: 202301042359
ISSUE DATE / TIME: 202212250846
ISSUE DATE / TIME: 202212250853
ISSUE DATE / TIME: 202212251038
ISSUE DATE / TIME: 202212251038
ISSUE DATE / TIME: 202212251038
ISSUE DATE / TIME: 202212251038
ISSUE DATE / TIME: 202212251050
ISSUE DATE / TIME: 202212251050
ISSUE DATE / TIME: 202212251110
ISSUE DATE / TIME: 202212251110
ISSUE DATE / TIME: 202212251110
ISSUE DATE / TIME: 202212251110
Unit Type and Rh: 600
Unit Type and Rh: 6200
Unit Type and Rh: 6200
Unit Type and Rh: 6200
Unit Type and Rh: 6200
Unit Type and Rh: 6200
Unit Type and Rh: 6200
Unit Type and Rh: 6200
Unit Type and Rh: 6200
Unit Type and Rh: 6200
Unit Type and Rh: 6200
Unit Type and Rh: 6200

## 2021-08-13 LAB — BASIC METABOLIC PANEL
Anion gap: 9 (ref 5–15)
BUN: 10 mg/dL (ref 8–23)
CO2: 22 mmol/L (ref 22–32)
Calcium: 7 mg/dL — ABNORMAL LOW (ref 8.9–10.3)
Chloride: 109 mmol/L (ref 98–111)
Creatinine, Ser: 0.67 mg/dL (ref 0.44–1.00)
GFR, Estimated: >60 mL/min (ref >60)
Glucose, Bld: 217 mg/dL — ABNORMAL HIGH (ref 70–99)
Potassium: 3.4 mmol/L — ABNORMAL LOW (ref 3.5–5.1)
Sodium: 140 mmol/L (ref 135–145)

## 2021-08-13 LAB — CBC
HCT: 25.4 % — ABNORMAL LOW (ref 36.0–46.0)
HCT: 26.2 % — ABNORMAL LOW (ref 36.0–46.0)
Hemoglobin: 8.9 g/dL — ABNORMAL LOW (ref 12.0–15.0)
Hemoglobin: 8.9 g/dL — ABNORMAL LOW (ref 12.0–15.0)
MCH: 33.8 pg (ref 26.0–34.0)
MCH: 33.3 pg (ref 26.0–34.0)
MCHC: 35 g/dL (ref 30.0–36.0)
MCHC: 34 g/dL (ref 30.0–36.0)
MCV: 96.6 fL (ref 80.0–100.0)
MCV: 98.1 fL (ref 80.0–100.0)
Platelets: 99 10*3/uL — ABNORMAL LOW (ref 150–400)
Platelets: 102 10*3/uL — ABNORMAL LOW (ref 150–400)
RBC: 2.63 MIL/uL — ABNORMAL LOW (ref 3.87–5.11)
RBC: 2.67 MIL/uL — ABNORMAL LOW (ref 3.87–5.11)
RDW: 17.3 % — ABNORMAL HIGH (ref 11.5–15.5)
RDW: 17.5 % — ABNORMAL HIGH (ref 11.5–15.5)
WBC: 6.7 10*3/uL (ref 4.0–10.5)
WBC: 7.6 10*3/uL (ref 4.0–10.5)
nRBC: 0 % (ref 0.0–0.2)
nRBC: 0.3 % — ABNORMAL HIGH (ref 0.0–0.2)

## 2021-08-13 LAB — BLOOD PRODUCT ORDER (VERBAL) VERIFICATION

## 2021-08-13 LAB — TRIGLYCERIDES: Triglycerides: 45 mg/dL (ref <150)

## 2021-08-13 LAB — MASSIVE TRANSFUSION PROTOCOL ORDER (BLOOD BANK NOTIFICATION)

## 2021-08-13 MED ORDER — THIAMINE HCL 100 MG PO TABS
100.0000 mg | ORAL_TABLET | Freq: Every day | ORAL | Status: DC
  Filled 2021-08-13: qty 1

## 2021-08-13 MED ORDER — ATROPINE SULFATE 1 MG/10ML IJ SOSY
PREFILLED_SYRINGE | INTRAMUSCULAR | Status: AC
  Filled 2021-08-13: qty 10

## 2021-08-13 MED ORDER — THIAMINE HCL 100 MG/ML IJ SOLN
100.0000 mg | Freq: Every day | INTRAMUSCULAR | Status: DC
  Administered 2021-08-13 – 2021-08-15 (×3): 100 mg via INTRAVENOUS
  Filled 2021-08-13 (×3): qty 2

## 2021-08-13 MED ORDER — LORAZEPAM 1 MG PO TABS: 1.0000 mg | ORAL_TABLET | ORAL | Status: AC | PRN

## 2021-08-13 MED ORDER — ADULT MULTIVITAMIN W/MINERALS CH
1.0000 | ORAL_TABLET | Freq: Every day | ORAL | Status: DC
  Filled 2021-08-13: qty 1

## 2021-08-13 MED ORDER — ACETAMINOPHEN 650 MG RE SUPP
650.0000 mg | RECTAL | Status: DC | PRN
  Administered 2021-08-13 – 2021-08-15 (×3): 650 mg via RECTAL
  Filled 2021-08-13 (×2): qty 1

## 2021-08-13 MED ORDER — GADOBUTROL 1 MMOL/ML IV SOLN
7.0000 mL | Freq: Once | INTRAVENOUS | Status: AC | PRN
  Administered 2021-08-13: 02:00:00 7 mL via INTRAVENOUS

## 2021-08-13 MED ORDER — FOLIC ACID 1 MG PO TABS
1.0000 mg | ORAL_TABLET | Freq: Every day | ORAL | Status: DC
  Filled 2021-08-13: qty 1

## 2021-08-13 MED ORDER — ACETAMINOPHEN 650 MG RE SUPP
325.0000 mg | RECTAL | Status: DC | PRN
  Filled 2021-08-13: qty 1

## 2021-08-13 MED ORDER — LORAZEPAM 2 MG/ML IJ SOLN: 1.0000 mg | INTRAMUSCULAR | Status: AC | PRN

## 2021-08-13 NOTE — Progress Notes (Signed)
PT Cancellation Note  Patient Details Name: April Robertson MRN: [id number] DOB: 07-Feb-1956   Cancelled Treatment:    Reason Eval/Treat Not Completed: Patient not medically ready. Awaiting medical plan for complex c2 neck injury. PT to return as able, as appropriate to complete PT eval.  Lewis Shock, PT, DPT Acute Rehabilitation Services Pager #: [phone] Office #: [phone]    Iona Hansen 08/13/2021, 10:28 AM

## 2021-08-13 NOTE — Progress Notes (Signed)
TRN transported pt to MRI with RRT and primary RN. Prior to going to MRI, this RN pulled an extra vial of propofol, which was started while down in MRI, as the previous vial ran out. (New vial started at the same rate/dose as the previous vial). This RN, RRT and primary RN remained with the pt in MRI for the entirety of the patients scan. Pt is back in room, family is at bedside.

## 2021-08-13 NOTE — Progress Notes (Signed)
Patient transported to MRI 

## 2021-08-13 NOTE — Progress Notes (Signed)
OT Cancellation Note  Patient Details Name: April Robertson MRN: [id number] DOB: [birth year]   Cancelled Treatment:    Reason Eval/Treat Not Completed: Patient not medically ready  Burnett Corrente Keithan Dileonardo, OT/L   Acute OT Clinical Specialist Acute Rehabilitation Services Pager [phone] Office [phone]  08/13/2021, 9:11 AM

## 2021-08-13 NOTE — Progress Notes (Signed)
°  Transition of Care [facility]) Screening Note   Patient Details  Name: April Robertson Date of Birth: 09/04/1955   Transition of Care [facility]) CM/SW Contact:    Baldemar Lenis, LCSW Phone Number: 08/13/2021, 3:21 PM    Transition of Care Department [facility]) has reviewed patient and no TOC needs have been identified at this time; medical workup ongoing. We will continue to monitor patient advancement through interdisciplinary progression rounds. If new patient transition needs arise, please place a TOC consult.

## 2021-08-13 NOTE — Progress Notes (Signed)
Patient transported from MRI back to 4N20 without complications.
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
## 2021-08-14 ENCOUNTER — Inpatient Hospital Stay (HOSPITAL_COMMUNITY): Payer: BC Managed Care – PPO

## 2021-08-14 ENCOUNTER — Encounter (HOSPITAL_COMMUNITY): Payer: Self-pay

## 2021-08-14 DIAGNOSIS — S12190A Other displaced fracture of second cervical vertebra, initial encounter for closed fracture: Secondary | ICD-10-CM

## 2021-08-14 DIAGNOSIS — Z515 Encounter for palliative care: Secondary | ICD-10-CM

## 2021-08-14 LAB — COMPREHENSIVE METABOLIC PANEL
ALT: 72 U/L — ABNORMAL HIGH (ref 0–44)
AST: 44 U/L — ABNORMAL HIGH (ref 15–41)
Albumin: 2.4 g/dL — ABNORMAL LOW (ref 3.5–5.0)
Alkaline Phosphatase: 47 U/L (ref 38–126)
Anion gap: 5 (ref 5–15)
BUN: 6 mg/dL — ABNORMAL LOW (ref 8–23)
CO2: 27 mmol/L (ref 22–32)
Calcium: 7.5 mg/dL — ABNORMAL LOW (ref 8.9–10.3)
Chloride: 107 mmol/L (ref 98–111)
Creatinine, Ser: 0.34 mg/dL — ABNORMAL LOW (ref 0.44–1.00)
GFR, Estimated: >60 mL/min (ref >60)
Glucose, Bld: 166 mg/dL — ABNORMAL HIGH (ref 70–99)
Potassium: 3.4 mmol/L — ABNORMAL LOW (ref 3.5–5.1)
Sodium: 139 mmol/L (ref 135–145)
Total Bilirubin: 0.8 mg/dL (ref 0.3–1.2)
Total Protein: 4.8 g/dL — ABNORMAL LOW (ref 6.5–8.1)

## 2021-08-14 LAB — CBC
HCT: 24.7 % — ABNORMAL LOW (ref 36.0–46.0)
Hemoglobin: 8.4 g/dL — ABNORMAL LOW (ref 12.0–15.0)
MCH: 33.9 pg (ref 26.0–34.0)
MCHC: 34 g/dL (ref 30.0–36.0)
MCV: 99.6 fL (ref 80.0–100.0)
Platelets: 90 10*3/uL — ABNORMAL LOW (ref 150–400)
RBC: 2.48 MIL/uL — ABNORMAL LOW (ref 3.87–5.11)
RDW: 17.2 % — ABNORMAL HIGH (ref 11.5–15.5)
WBC: 7.2 10*3/uL (ref 4.0–10.5)
nRBC: 0.3 % — ABNORMAL HIGH (ref 0.0–0.2)

## 2021-08-14 LAB — URINALYSIS, ROUTINE W REFLEX MICROSCOPIC
Bilirubin Urine: NEGATIVE
Glucose, UA: NEGATIVE mg/dL
Ketones, ur: NEGATIVE mg/dL
Nitrite: NEGATIVE
Protein, ur: NEGATIVE mg/dL
Specific Gravity, Urine: 1.015 (ref 1.005–1.030)
WBC, UA: >50 WBC/hpf — ABNORMAL HIGH (ref 0–5)
pH: 5 (ref 5.0–8.0)

## 2021-08-14 LAB — CBC WITH DIFFERENTIAL/PLATELET
Abs Immature Granulocytes: 0 10*3/uL (ref 0.00–0.07)
Basophils Absolute: 0 10*3/uL (ref 0.0–0.1)
Basophils Relative: 1 %
Eosinophils Absolute: 0 10*3/uL (ref 0.0–0.5)
Eosinophils Relative: 0 %
HCT: 25.7 % — ABNORMAL LOW (ref 36.0–46.0)
Hemoglobin: 8.4 g/dL — ABNORMAL LOW (ref 12.0–15.0)
Immature Granulocytes: 0 %
Lymphocytes Relative: 18 %
Lymphs Abs: 0.3 10*3/uL — ABNORMAL LOW (ref 0.7–4.0)
MCH: 33.3 pg (ref 26.0–34.0)
MCHC: 32.7 g/dL (ref 30.0–36.0)
MCV: 102 fL — ABNORMAL HIGH (ref 80.0–100.0)
Monocytes Absolute: 0.1 10*3/uL (ref 0.1–1.0)
Monocytes Relative: 6 %
Neutro Abs: 1.2 10*3/uL — ABNORMAL LOW (ref 1.7–7.7)
Neutrophils Relative %: 75 %
Platelets: 83 10*3/uL — ABNORMAL LOW (ref 150–400)
RBC: 2.52 MIL/uL — ABNORMAL LOW (ref 3.87–5.11)
RDW: 16.5 % — ABNORMAL HIGH (ref 11.5–15.5)
WBC: 1.6 10*3/uL — ABNORMAL LOW (ref 4.0–10.5)
nRBC: 0 % (ref 0.0–0.2)

## 2021-08-14 LAB — APTT: aPTT: 27 seconds (ref 24–36)

## 2021-08-14 LAB — PROTIME-INR
INR: 1 (ref 0.8–1.2)
Prothrombin Time: 13.5 seconds (ref 11.4–15.2)

## 2021-08-14 LAB — AMYLASE: Amylase: 48 U/L (ref 28–100)

## 2021-08-14 LAB — BASIC METABOLIC PANEL
Anion gap: 7 (ref 5–15)
BUN: 6 mg/dL — ABNORMAL LOW (ref 8–23)
CO2: 26 mmol/L (ref 22–32)
Calcium: 7.7 mg/dL — ABNORMAL LOW (ref 8.9–10.3)
Chloride: 106 mmol/L (ref 98–111)
Creatinine, Ser: 0.39 mg/dL — ABNORMAL LOW (ref 0.44–1.00)
GFR, Estimated: >60 mL/min (ref >60)
Glucose, Bld: 165 mg/dL — ABNORMAL HIGH (ref 70–99)
Potassium: 3.4 mmol/L — ABNORMAL LOW (ref 3.5–5.1)
Sodium: 139 mmol/L (ref 135–145)

## 2021-08-14 LAB — HEMOGLOBIN A1C
Hgb A1c MFr Bld: 4.9 % (ref 4.8–5.6)
Mean Plasma Glucose: 93.93 mg/dL

## 2021-08-14 LAB — LIPASE, BLOOD: Lipase: 22 U/L (ref 11–51)

## 2021-08-14 LAB — TROPONIN I (HIGH SENSITIVITY): Troponin I (High Sensitivity): 35 ng/L — ABNORMAL HIGH (ref <18)

## 2021-08-14 LAB — CK TOTAL AND CKMB (NOT AT ARMC)
CK, MB: 6.3 ng/mL — ABNORMAL HIGH (ref 0.5–5.0)
Relative Index: 1.8 (ref 0.0–2.5)
Total CK: 359 U/L — ABNORMAL HIGH (ref 38–234)

## 2021-08-14 LAB — RESP PANEL BY RT-PCR (FLU A&B, COVID) ARPGX2
Influenza A by PCR: NEGATIVE
Influenza B by PCR: NEGATIVE
SARS Coronavirus 2 by RT PCR: NEGATIVE

## 2021-08-14 LAB — BILIRUBIN, DIRECT: Bilirubin, Direct: 0.2 mg/dL (ref 0.0–0.2)

## 2021-08-14 LAB — LACTATE DEHYDROGENASE: LDH: 180 U/L (ref 98–192)

## 2021-08-14 LAB — GAMMA GT: GGT: 32 U/L (ref 7–50)

## 2021-08-14 NOTE — Progress Notes (Signed)
OT Cancellation Note  Patient Details Name: April Robertson MRN: [id number] DOB: 1956-03-15   Cancelled Treatment:    Reason Eval/Treat Not Completed: Other (comment) Discussed with nsg;palliative notes reviewed. Will sign off at this time. Please reorder if appropriate. Thank you.  Thornell Mule, OT/L   Acute OT Clinical Specialist Acute Rehabilitation Services Pager [phone] Office [phone]  08/14/2021, 8:33 AM

## 2021-08-14 NOTE — Anesthesia Postprocedure Evaluation (Signed)
Anesthesia Post Note  Patient: April Robertson  Procedure(Robertson) Performed: IR WITH ANESTHESIA     Patient location during evaluation: SICU Anesthesia Type: General Level of consciousness: sedated Pain management: pain level controlled Vital Signs Assessment: post-procedure vital signs reviewed and stable Respiratory status: patient remains intubated per anesthesia plan Cardiovascular status: stable Postop Assessment: no apparent nausea or vomiting Anesthetic complications: no   No notable events documented.  Last Vitals:  Vitals:   08/14/21 0615 08/14/21 0700  BP:  119/74  Pulse: 82 82  Resp: (!) 0 (!) 0  Temp:    SpO2: 96% 96%    Last Pain:  Vitals:   08/14/21 0400  TempSrc: Oral  PainSc:                  April Robertson

## 2021-08-14 NOTE — Progress Notes (Signed)
Pt's family requesting organ donation and to withdraw on patient tomorrow. Spoke with [employer] from [place].  He is working on getting someone on site asap.  Updated family of this. Ref # [id number]

## 2021-08-14 NOTE — Progress Notes (Signed)
BAL sample from bronch walked down to lab.
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
## 2021-08-14 NOTE — Progress Notes (Signed)
Palliative:  Chart reviewed - [facility] involved. Reviewed Dr. Marvetta Gibbons note - maintain full code until [facility] evaluation. Went to room - multiple visitors at bedside. Discussed with RN - no palliative needs identified, patient's spouse well supported between visitors and [facility] discussions.   Gerlean Ren, DNP, AGNP-C Palliative Medicine Team Team Phone # [phone]  Pager # [phone]   NO CHARGE
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
## 2021-08-14 NOTE — Progress Notes (Signed)
PT Cancellation Note  Patient Details Name: April Robertson MRN: [id number] DOB: [birth year]   Cancelled Treatment:    Reason Eval/Treat Not Completed: Patient not medically ready. Pt with poor prognosis, "very complex" unstable C2 fx, on vent and not arousing at this time. PT will sign off for now. Please re-consult if pt becomes medically appropriate.    [name] Cashtyn Pouliot 08/14/2021, 8:32 AM

## 2021-08-14 NOTE — Progress Notes (Signed)
RT completed a 2 minute recruitment maneuver due to desat. PC 30, Peep 5, RR 10, iTime 3.00, FIO2 100%. Sats improved from 85% to 98%. Patient placed back on previous vent settings.  RN notified.

## 2021-08-15 ENCOUNTER — Inpatient Hospital Stay (HOSPITAL_COMMUNITY): Payer: BC Managed Care – PPO

## 2021-08-15 DIAGNOSIS — Z529 Donor of unspecified organ or tissue: Secondary | ICD-10-CM | POA: Diagnosis not present

## 2021-08-15 LAB — COMPREHENSIVE METABOLIC PANEL
ALT: 59 U/L — ABNORMAL HIGH (ref 0–44)
ALT: 48 U/L — ABNORMAL HIGH (ref 0–44)
AST: 33 U/L (ref 15–41)
AST: 24 U/L (ref 15–41)
Albumin: 2.1 g/dL — ABNORMAL LOW (ref 3.5–5.0)
Albumin: 1.9 g/dL — ABNORMAL LOW (ref 3.5–5.0)
Alkaline Phosphatase: 39 U/L (ref 38–126)
Alkaline Phosphatase: 35 U/L — ABNORMAL LOW (ref 38–126)
Anion gap: 7 (ref 5–15)
Anion gap: 2 — ABNORMAL LOW (ref 5–15)
BUN: 5 mg/dL — ABNORMAL LOW (ref 8–23)
BUN: 7 mg/dL — ABNORMAL LOW (ref 8–23)
CO2: 27 mmol/L (ref 22–32)
CO2: 30 mmol/L (ref 22–32)
Calcium: 7.5 mg/dL — ABNORMAL LOW (ref 8.9–10.3)
Calcium: 7.7 mg/dL — ABNORMAL LOW (ref 8.9–10.3)
Chloride: 106 mmol/L (ref 98–111)
Chloride: 104 mmol/L (ref 98–111)
Creatinine, Ser: 0.37 mg/dL — ABNORMAL LOW (ref 0.44–1.00)
Creatinine, Ser: 0.46 mg/dL (ref 0.44–1.00)
GFR, Estimated: >60 mL/min (ref >60)
GFR, Estimated: >60 mL/min (ref >60)
Glucose, Bld: 148 mg/dL — ABNORMAL HIGH (ref 70–99)
Glucose, Bld: 129 mg/dL — ABNORMAL HIGH (ref 70–99)
Potassium: 3 mmol/L — ABNORMAL LOW (ref 3.5–5.1)
Potassium: 4 mmol/L (ref 3.5–5.1)
Sodium: 140 mmol/L (ref 135–145)
Sodium: 136 mmol/L (ref 135–145)
Total Bilirubin: 0.7 mg/dL (ref 0.3–1.2)
Total Bilirubin: 0.8 mg/dL (ref 0.3–1.2)
Total Protein: 4.5 g/dL — ABNORMAL LOW (ref 6.5–8.1)
Total Protein: 4.5 g/dL — ABNORMAL LOW (ref 6.5–8.1)

## 2021-08-15 LAB — ECHOCARDIOGRAM COMPLETE
AR max vel: 2.97 cm2
AV Peak grad: 8.8 mmHg
Ao pk vel: 1.48 m/s
Area-P 1/2: 3.51 cm2
Calc EF: 48.7 %
Height: 65 in
MV M vel: 5.99 m/s
MV Peak grad: 143.5 mmHg
S' Lateral: 2.5 cm
Single Plane A2C EF: 50.4 %
Single Plane A4C EF: 47.9 %
Weight: 2400 oz

## 2021-08-15 LAB — POCT I-STAT 7, (LYTES, BLD GAS, ICA,H+H)
Acid-Base Excess: 1 mmol/L (ref 0.0–2.0)
Acid-Base Excess: 2 mmol/L (ref 0.0–2.0)
Acid-Base Excess: 3 mmol/L — ABNORMAL HIGH (ref 0.0–2.0)
Acid-base deficit: 1 mmol/L (ref 0.0–2.0)
Bicarbonate: 26.3 mmol/L (ref 20.0–28.0)
Bicarbonate: 28.8 mmol/L — ABNORMAL HIGH (ref 20.0–28.0)
Bicarbonate: 28.6 mmol/L — ABNORMAL HIGH (ref 20.0–28.0)
Bicarbonate: 30.6 mmol/L — ABNORMAL HIGH (ref 20.0–28.0)
Calcium, Ion: 1.15 mmol/L (ref 1.15–1.40)
Calcium, Ion: 1.21 mmol/L (ref 1.15–1.40)
Calcium, Ion: 1.2 mmol/L (ref 1.15–1.40)
Calcium, Ion: 1.21 mmol/L (ref 1.15–1.40)
HCT: 21 % — ABNORMAL LOW (ref 36.0–46.0)
HCT: 24 % — ABNORMAL LOW (ref 36.0–46.0)
HCT: 22 % — ABNORMAL LOW (ref 36.0–46.0)
HCT: 22 % — ABNORMAL LOW (ref 36.0–46.0)
Hemoglobin: 7.1 g/dL — ABNORMAL LOW (ref 12.0–15.0)
Hemoglobin: 8.2 g/dL — ABNORMAL LOW (ref 12.0–15.0)
Hemoglobin: 7.5 g/dL — ABNORMAL LOW (ref 12.0–15.0)
Hemoglobin: 7.5 g/dL — ABNORMAL LOW (ref 12.0–15.0)
O2 Saturation: 93 %
O2 Saturation: 98 %
O2 Saturation: 97 %
O2 Saturation: 95 %
Patient temperature: 97.5
Patient temperature: 97.5
Patient temperature: 96.4
Patient temperature: 98.5
Potassium: 2.8 mmol/L — ABNORMAL LOW (ref 3.5–5.1)
Potassium: 3.3 mmol/L — ABNORMAL LOW (ref 3.5–5.1)
Potassium: 3.8 mmol/L (ref 3.5–5.1)
Potassium: 4.1 mmol/L (ref 3.5–5.1)
Sodium: 141 mmol/L (ref 135–145)
Sodium: 139 mmol/L (ref 135–145)
Sodium: 138 mmol/L (ref 135–145)
Sodium: 138 mmol/L (ref 135–145)
TCO2: 28 mmol/L (ref 22–32)
TCO2: 31 mmol/L (ref 22–32)
TCO2: 30 mmol/L (ref 22–32)
TCO2: 33 mmol/L — ABNORMAL HIGH (ref 22–32)
pCO2 arterial: 61.7 mmHg — ABNORMAL HIGH (ref 32.0–48.0)
pCO2 arterial: 62.6 mmHg — ABNORMAL HIGH (ref 32.0–48.0)
pCO2 arterial: 53.7 mmHg — ABNORMAL HIGH (ref 32.0–48.0)
pCO2 arterial: 69 mmHg (ref 32.0–48.0)
pH, Arterial: 7.234 — ABNORMAL LOW (ref 7.350–7.450)
pH, Arterial: 7.268 — ABNORMAL LOW (ref 7.350–7.450)
pH, Arterial: 7.328 — ABNORMAL LOW (ref 7.350–7.450)
pH, Arterial: 7.254 — ABNORMAL LOW (ref 7.350–7.450)
pO2, Arterial: 79 mmHg — ABNORMAL LOW (ref 83.0–108.0)
pO2, Arterial: 122 mmHg — ABNORMAL HIGH (ref 83.0–108.0)
pO2, Arterial: 97 mmHg (ref 83.0–108.0)
pO2, Arterial: 92 mmHg (ref 83.0–108.0)

## 2021-08-15 LAB — PROTIME-INR
INR: 1 (ref 0.8–1.2)
INR: 1.3 — ABNORMAL HIGH (ref 0.8–1.2)
Prothrombin Time: 13.6 seconds (ref 11.4–15.2)
Prothrombin Time: 15.7 seconds — ABNORMAL HIGH (ref 11.4–15.2)

## 2021-08-15 LAB — CBC
HCT: 26.2 % — ABNORMAL LOW (ref 36.0–46.0)
HCT: 23.7 % — ABNORMAL LOW (ref 36.0–46.0)
Hemoglobin: 8.3 g/dL — ABNORMAL LOW (ref 12.0–15.0)
Hemoglobin: 7.8 g/dL — ABNORMAL LOW (ref 12.0–15.0)
MCH: 32.5 pg (ref 26.0–34.0)
MCH: 33.8 pg (ref 26.0–34.0)
MCHC: 31.7 g/dL (ref 30.0–36.0)
MCHC: 32.9 g/dL (ref 30.0–36.0)
MCV: 102.7 fL — ABNORMAL HIGH (ref 80.0–100.0)
MCV: 102.6 fL — ABNORMAL HIGH (ref 80.0–100.0)
Platelets: 75 K/uL — ABNORMAL LOW (ref 150–400)
Platelets: 88 10*3/uL — ABNORMAL LOW (ref 150–400)
RBC: 2.55 MIL/uL — ABNORMAL LOW (ref 3.87–5.11)
RBC: 2.31 MIL/uL — ABNORMAL LOW (ref 3.87–5.11)
RDW: 15.8 % — ABNORMAL HIGH (ref 11.5–15.5)
RDW: 15.2 % (ref 11.5–15.5)
WBC: 1.3 K/uL — CL (ref 4.0–10.5)
WBC: 1.8 10*3/uL — ABNORMAL LOW (ref 4.0–10.5)
nRBC: 0 % (ref 0.0–0.2)
nRBC: 4.5 % — ABNORMAL HIGH (ref 0.0–0.2)

## 2021-08-15 LAB — GLUCOSE, CAPILLARY: Glucose-Capillary: 155 mg/dL — ABNORMAL HIGH (ref 70–99)

## 2021-08-15 LAB — APTT
aPTT: 27 seconds (ref 24–36)
aPTT: 30 seconds (ref 24–36)

## 2021-08-15 LAB — MAGNESIUM: Magnesium: 1.6 mg/dL — ABNORMAL LOW (ref 1.7–2.4)

## 2021-08-15 LAB — BILIRUBIN, DIRECT
Bilirubin, Direct: 0.2 mg/dL (ref 0.0–0.2)
Bilirubin, Direct: 0.2 mg/dL (ref 0.0–0.2)

## 2021-08-15 LAB — PHOSPHORUS: Phosphorus: 1.9 mg/dL — ABNORMAL LOW (ref 2.5–4.6)

## 2021-08-15 MED ORDER — FLUCONAZOLE IN SODIUM CHLORIDE 400-0.9 MG/200ML-% IV SOLN
400.0000 mg | Freq: Once | INTRAVENOUS | Status: AC
Start: 2021-08-15 — End: 2021-08-15
  Administered 2021-08-15: 10:00:00 400 mg via INTRAVENOUS
  Filled 2021-08-15: qty 200

## 2021-08-15 MED ORDER — CIPROFLOXACIN IN D5W 400 MG/200ML IV SOLN
400.0000 mg | Freq: Once | INTRAVENOUS | Status: AC
  Administered 2021-08-15: 11:00:00 400 mg via INTRAVENOUS
  Filled 2021-08-15: qty 200

## 2021-08-15 MED ORDER — VANCOMYCIN HCL IN DEXTROSE 1-5 GM/200ML-% IV SOLN
1000.0000 mg | Freq: Once | INTRAVENOUS | Status: AC
  Administered 2021-08-15: 10:00:00 1000 mg via INTRAVENOUS
  Filled 2021-08-15: qty 200

## 2021-08-15 MED ORDER — POTASSIUM CHLORIDE 10 MEQ/100ML IV SOLN
10.0000 meq | INTRAVENOUS | Status: AC
  Administered 2021-08-15 (×4): 10 meq via INTRAVENOUS
  Filled 2021-08-15 (×4): qty 100

## 2021-08-15 NOTE — Progress Notes (Signed)
Pt transported from 4N20 to CT and back with no complications.

## 2021-08-15 NOTE — Progress Notes (Signed)
Recruitment maneuver performed. 

## 2021-08-15 NOTE — Progress Notes (Signed)
Recruitment maneuver performed per [facility]
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
## 2021-08-15 NOTE — Progress Notes (Signed)
Dr. Bedelia Person at bedside for BAL. Time Out completed.  08/14/21 2202  Time-Out  Pt Identifiers(min of two) First/Last Name  Correct Site? Yes  Site Marked? N/A  Correct Side? N/A  Correct Patient Position? Yes  Correct Procedure? Yes  What Procedure? Bronchoscopy (BAL)  Antibiotics Ordered/Given? No  Consents Verified? Yes  Rad Studies Available? Yes  Lab Results Available? Yes  Verification-Surgical Team Non-Applicable  Safety Precautions Reviewed? Yes  Maximum Barrier Precautions  Hand hygiene completed? Yes  Patient Drape Partial body drape  Mask/Eye Shield Yes  Sterile Gloves Yes  Sterile Gown Yes  Cap N/A  Skin Preparation N/A  Was skin preparation agent completely dry at  time of first skin puncture? N/A  Special Equipment/requirements Yes (comment)

## 2021-08-16 ENCOUNTER — Encounter (HOSPITAL_COMMUNITY): Payer: Self-pay | Admitting: Anesthesiology

## 2021-08-16 ENCOUNTER — Encounter (HOSPITAL_COMMUNITY): Admission: EM | Disposition: E | Payer: Self-pay | Source: Home / Self Care

## 2021-08-16 HISTORY — PX: ORGAN PROCUREMENT: SHX5270

## 2021-08-16 LAB — TYPE AND SCREEN
ABO/RH(D): O POS
Antibody Screen: NEGATIVE
Unit division: 0
Unit division: 0
Unit division: 0
Unit division: 0
Unit division: 0
Unit division: 0
Unit division: 0
Unit division: 0
Unit division: 0
Unit division: 0
Unit division: 0
Unit division: 0
Unit division: 0
Unit division: 0

## 2021-08-16 LAB — BPAM RBC
Blood Product Expiration Date: 202301032359
Blood Product Expiration Date: 202301092359
Blood Product Expiration Date: 202301112359
Blood Product Expiration Date: 202301162359
Blood Product Expiration Date: 202301162359
Blood Product Expiration Date: 202301162359
Blood Product Expiration Date: 202301162359
Blood Product Expiration Date: 202301162359
Blood Product Expiration Date: 202301162359
Blood Product Expiration Date: 202301162359
Blood Product Expiration Date: 202301162359
Blood Product Expiration Date: 202301162359
Blood Product Expiration Date: 202301162359
Blood Product Expiration Date: 202301162359
ISSUE DATE / TIME: 202212250833
ISSUE DATE / TIME: 202212250839
ISSUE DATE / TIME: 202212251038
ISSUE DATE / TIME: 202212251038
ISSUE DATE / TIME: 202212251048
ISSUE DATE / TIME: 202212260630
ISSUE DATE / TIME: 202212260919
ISSUE DATE / TIME: 202212261421
ISSUE DATE / TIME: 202212270441
ISSUE DATE / TIME: 202212270656
ISSUE DATE / TIME: 202212280745
ISSUE DATE / TIME: 202212280745
ISSUE DATE / TIME: 202212280745
ISSUE DATE / TIME: 202212290531
Unit Type and Rh: 5100
Unit Type and Rh: 5100
Unit Type and Rh: 5100
Unit Type and Rh: 5100
Unit Type and Rh: 5100
Unit Type and Rh: 5100
Unit Type and Rh: 5100
Unit Type and Rh: 5100
Unit Type and Rh: 5100
Unit Type and Rh: 5100
Unit Type and Rh: 5100
Unit Type and Rh: 5100
Unit Type and Rh: 5100
Unit Type and Rh: 5100

## 2021-08-16 LAB — COMPREHENSIVE METABOLIC PANEL
ALT: 43 U/L (ref 0–44)
AST: 22 U/L (ref 15–41)
Albumin: 1.8 g/dL — ABNORMAL LOW (ref 3.5–5.0)
Alkaline Phosphatase: 33 U/L — ABNORMAL LOW (ref 38–126)
Anion gap: 8 (ref 5–15)
BUN: 7 mg/dL — ABNORMAL LOW (ref 8–23)
CO2: 26 mmol/L (ref 22–32)
Calcium: 7.8 mg/dL — ABNORMAL LOW (ref 8.9–10.3)
Chloride: 99 mmol/L (ref 98–111)
Creatinine, Ser: 0.42 mg/dL — ABNORMAL LOW (ref 0.44–1.00)
GFR, Estimated: >60 mL/min (ref >60)
Glucose, Bld: 132 mg/dL — ABNORMAL HIGH (ref 70–99)
Potassium: 3.5 mmol/L (ref 3.5–5.1)
Sodium: 133 mmol/L — ABNORMAL LOW (ref 135–145)
Total Bilirubin: 1 mg/dL (ref 0.3–1.2)
Total Protein: 4.5 g/dL — ABNORMAL LOW (ref 6.5–8.1)

## 2021-08-16 LAB — CULTURE, BAL-QUANTITATIVE W GRAM STAIN: Culture: >=100000 — AB

## 2021-08-16 LAB — CBC
HCT: 23.4 % — ABNORMAL LOW (ref 36.0–46.0)
Hemoglobin: 7.7 g/dL — ABNORMAL LOW (ref 12.0–15.0)
MCH: 33.3 pg (ref 26.0–34.0)
MCHC: 32.9 g/dL (ref 30.0–36.0)
MCV: 101.3 fL — ABNORMAL HIGH (ref 80.0–100.0)
Platelets: 92 10*3/uL — ABNORMAL LOW (ref 150–400)
RBC: 2.31 MIL/uL — ABNORMAL LOW (ref 3.87–5.11)
RDW: 15 % (ref 11.5–15.5)
WBC: 3.2 10*3/uL — ABNORMAL LOW (ref 4.0–10.5)
nRBC: 2.8 % — ABNORMAL HIGH (ref 0.0–0.2)

## 2021-08-16 LAB — PHOSPHORUS: Phosphorus: 1.7 mg/dL — ABNORMAL LOW (ref 2.5–4.6)

## 2021-08-16 LAB — URINALYSIS, ROUTINE W REFLEX MICROSCOPIC
Bilirubin Urine: NEGATIVE
Glucose, UA: NEGATIVE mg/dL
Ketones, ur: NEGATIVE mg/dL
Leukocytes,Ua: NEGATIVE
Nitrite: NEGATIVE
Protein, ur: NEGATIVE mg/dL
Specific Gravity, Urine: 1.017 (ref 1.005–1.030)
pH: 5 (ref 5.0–8.0)

## 2021-08-16 LAB — URINE CULTURE: Culture: >=100000 — AB

## 2021-08-16 LAB — BILIRUBIN, DIRECT: Bilirubin, Direct: 0.4 mg/dL — ABNORMAL HIGH (ref 0.0–0.2)

## 2021-08-16 LAB — TRIGLYCERIDES: Triglycerides: 28 mg/dL (ref <150)

## 2021-08-16 LAB — GLUCOSE, CAPILLARY: Glucose-Capillary: 170 mg/dL — ABNORMAL HIGH (ref 70–99)

## 2021-08-16 LAB — PROTIME-INR
INR: 1.3 — ABNORMAL HIGH (ref 0.8–1.2)
Prothrombin Time: 15.7 seconds — ABNORMAL HIGH (ref 11.4–15.2)

## 2021-08-16 LAB — MAGNESIUM: Magnesium: 1.5 mg/dL — ABNORMAL LOW (ref 1.7–2.4)

## 2021-08-16 LAB — APTT: aPTT: 34 seconds (ref 24–36)

## 2021-08-16 SURGERY — SURGICAL PROCUREMENT, ORGAN
Anesthesia: Choice | Site: Abdomen

## 2021-08-16 MED ORDER — 0.9 % SODIUM CHLORIDE (POUR BTL) OPTIME
TOPICAL | Status: DC | PRN
  Administered 2021-08-16: 20:00:00 3000 mL

## 2021-08-16 MED ORDER — PIPERACILLIN-TAZOBACTAM 3.375 G IVPB 30 MIN
3.3750 g | Freq: Once | INTRAVENOUS | Status: AC
Start: 2021-08-16 — End: 2021-08-16
  Administered 2021-08-16: 17:00:00 3.375 g via INTRAVENOUS
  Filled 2021-08-16 (×2): qty 50

## 2021-08-16 MED ORDER — MAGNESIUM SULFATE 2 GM/50ML IV SOLN
2.0000 g | Freq: Once | INTRAVENOUS | Status: AC
  Administered 2021-08-16: 11:00:00 2 g via INTRAVENOUS
  Filled 2021-08-16: qty 50

## 2021-08-16 MED ORDER — LORAZEPAM 2 MG/ML IJ SOLN
1.0000 mg | INTRAMUSCULAR | Status: DC | PRN
Start: 2021-08-16 — End: 2021-08-17
  Filled 2021-08-16 (×2): qty 1

## 2021-08-16 MED ORDER — HEPARIN SODIUM (PORCINE) 1000 UNIT/ML IJ SOLN
6800.0000 [IU] | Freq: Once | INTRAMUSCULAR | Status: DC
  Filled 2021-08-16: qty 10

## 2021-08-16 MED ORDER — POTASSIUM PHOSPHATES 15 MMOLE/5ML IV SOLN
30.0000 mmol | Freq: Once | INTRAVENOUS | Status: AC
  Administered 2021-08-16: 11:00:00 30 mmol via INTRAVENOUS
  Filled 2021-08-16: qty 10

## 2021-08-16 MED ORDER — GLYCOPYRROLATE 0.2 MG/ML IJ SOLN
0.2000 mg | INTRAMUSCULAR | Status: DC | PRN
  Filled 2021-08-16: qty 1

## 2021-08-16 SURGICAL SUPPLY — 93 items
APPLIER CLIP 11 MED OPEN (CLIP) ×2
APR CLP MED 11 20 MLT OPN (CLIP) ×1
BAG COUNTER SPONGE SURGICOUNT (BAG) ×3 IMPLANT
BAG SPNG CNTER NS LX DISP (BAG) ×1
BLADE CLIPPER SURG (BLADE) IMPLANT
BLADE SAW STERNAL (BLADE) ×3 IMPLANT
BLADE STERNUM SYSTEM 6 (BLADE) ×1 IMPLANT
BLADE SURG 10 STRL SS (BLADE) IMPLANT
CLIP APPLIE 11 MED OPEN (CLIP) ×2 IMPLANT
CLIP TI MEDIUM 24 (CLIP) ×1 IMPLANT
CLIP VESOCCLUDE MED 24/CT (CLIP) IMPLANT
CLIP VESOCCLUDE SM WIDE 24/CT (CLIP) IMPLANT
CNTNR URN SCR LID CUP LEK RST (MISCELLANEOUS) ×2 IMPLANT
CONT SPEC 4OZ STRL OR WHT (MISCELLANEOUS) ×2
COVER BACK TABLE 60X90IN (DRAPES) IMPLANT
COVER MAYO STAND STRL (DRAPES) ×2 IMPLANT
COVER SURGICAL LIGHT HANDLE (MISCELLANEOUS) ×3 IMPLANT
DRAPE HALF SHEET 40X57 (DRAPES) ×1 IMPLANT
DRAPE SLUSH MACHINE 52X66 (DRAPES) ×3 IMPLANT
DRAPE SLUSH/WARMER DISC (DRAPES) ×1 IMPLANT
DRSG COVADERM 4X10 (GAUZE/BANDAGES/DRESSINGS) IMPLANT
DRSG COVADERM 4X14 (GAUZE/BANDAGES/DRESSINGS) ×2 IMPLANT
DRSG TELFA 3X8 NADH (GAUZE/BANDAGES/DRESSINGS) ×2 IMPLANT
DURAPREP 26ML APPLICATOR (WOUND CARE) IMPLANT
ELECT BLADE 6.5 EXT (BLADE) IMPLANT
ELECT REM PT RETURN 9FT ADLT (ELECTROSURGICAL)
ELECTRODE REM PT RTRN 9FT ADLT (ELECTROSURGICAL) ×4 IMPLANT
GAUZE 4X4 16PLY ~~LOC~~+RFID DBL (SPONGE) ×1 IMPLANT
GLOVE SRG 8 PF TXTR STRL LF DI (GLOVE) IMPLANT
GLOVE SURG ENC MOIS LTX SZ7 (GLOVE) IMPLANT
GLOVE SURG ENC MOIS LTX SZ7.5 (GLOVE) IMPLANT
GLOVE SURG ENC MOIS LTX SZ8 (GLOVE) IMPLANT
GLOVE SURG ENC MOIS LTX SZ8.5 (GLOVE) IMPLANT
GLOVE SURG POLYISO LF SZ7 (GLOVE) IMPLANT
GLOVE SURG POLYISO LF SZ7.5 (GLOVE) IMPLANT
GLOVE SURG POLYISO LF SZ8 (GLOVE) IMPLANT
GLOVE SURG UNDER POLY LF SZ7 (GLOVE) IMPLANT
GLOVE SURG UNDER POLY LF SZ7.5 (GLOVE) IMPLANT
GLOVE SURG UNDER POLY LF SZ8 (GLOVE)
GLOVE SURG UNDER POLY LF SZ8.5 (GLOVE) IMPLANT
GOWN STRL REUS W/ TWL LRG LVL3 (GOWN DISPOSABLE) ×8 IMPLANT
GOWN STRL REUS W/ TWL XL LVL3 (GOWN DISPOSABLE) ×4 IMPLANT
GOWN STRL REUS W/TWL LRG LVL3 (GOWN DISPOSABLE) ×8
GOWN STRL REUS W/TWL XL LVL3 (GOWN DISPOSABLE) ×4
HANDLE SUCTION POOLE (INSTRUMENTS) IMPLANT
INSERT FOGARTY 61MM (MISCELLANEOUS) ×1 IMPLANT
KIT BASIN OR (CUSTOM PROCEDURE TRAY) ×1 IMPLANT
KIT POST MORTEM ADULT 36X90 (BAG) ×3 IMPLANT
KIT TURNOVER KIT B (KITS) ×3 IMPLANT
LOOP VESSEL MAXI BLUE (MISCELLANEOUS) IMPLANT
LOOP VESSEL MINI RED (MISCELLANEOUS) IMPLANT
MANIFOLD NEPTUNE II (INSTRUMENTS) ×3 IMPLANT
NDL BIOPSY 14X6 SOFT TISS (NEEDLE) IMPLANT
NEEDLE BIOPSY 14X6 SOFT TISS (NEEDLE) ×2 IMPLANT
NS IRRIG 1000ML POUR BTL (IV SOLUTION) IMPLANT
PACK AORTA (CUSTOM PROCEDURE TRAY) ×3 IMPLANT
PAD ARMBOARD 7.5X6 YLW CONV (MISCELLANEOUS) ×6 IMPLANT
PAD DRESSING TELFA 3X8 NADH (GAUZE/BANDAGES/DRESSINGS) ×2 IMPLANT
PENCIL BUTTON HOLSTER BLD 10FT (ELECTRODE) ×2 IMPLANT
SOL PREP POV-IOD 4OZ 10% (MISCELLANEOUS) ×4 IMPLANT
SPONGE INTESTINAL PEANUT (DISPOSABLE) IMPLANT
SPONGE T-LAP 18X18 ~~LOC~~+RFID (SPONGE) ×2 IMPLANT
STAPLER VISISTAT 35W (STAPLE) ×3 IMPLANT
SUCTION POOLE HANDLE (INSTRUMENTS) ×4
SUT BONE WAX W31G (SUTURE) IMPLANT
SUT ETHIBOND 5 LR DA (SUTURE) IMPLANT
SUT ETHILON 1 LR 30 (SUTURE) ×6 IMPLANT
SUT ETHILON 2 LR (SUTURE) ×4 IMPLANT
SUT PDS AB 4-0 RB1 27 (SUTURE) ×1 IMPLANT
SUT PROLENE 3 0 RB 1 (SUTURE) IMPLANT
SUT PROLENE 3 0 SH 1 (SUTURE) IMPLANT
SUT PROLENE 4 0 RB 1 (SUTURE)
SUT PROLENE 4-0 RB1 .5 CRCL 36 (SUTURE) IMPLANT
SUT PROLENE 5 0 C 1 24 (SUTURE) IMPLANT
SUT PROLENE 6 0 BV (SUTURE) IMPLANT
SUT SILK 0 TIES 10X30 (SUTURE) IMPLANT
SUT SILK 1 SH (SUTURE) IMPLANT
SUT SILK 1 TIES 10X30 (SUTURE) ×1 IMPLANT
SUT SILK 2 0 (SUTURE)
SUT SILK 2 0 SH (SUTURE) IMPLANT
SUT SILK 2 0 SH CR/8 (SUTURE) IMPLANT
SUT SILK 2 0 TIES 10X30 (SUTURE) IMPLANT
SUT SILK 2-0 18XBRD TIE 12 (SUTURE) IMPLANT
SUT SILK 3 0 SH CR/8 (SUTURE) IMPLANT
SUT SILK 3 0 TIES 10X30 (SUTURE) IMPLANT
SWAB COLLECTION DEVICE MRSA (MISCELLANEOUS) IMPLANT
SWAB CULTURE ESWAB REG 1ML (MISCELLANEOUS) IMPLANT
SYR 50ML LL SCALE MARK (SYRINGE) IMPLANT
SYRINGE TOOMEY DISP (SYRINGE) IMPLANT
TAPE UMBILICAL 1/8 X36 TWILL (MISCELLANEOUS) IMPLANT
TUBE CONNECTING 12X1/4 (SUCTIONS) ×4 IMPLANT
WATER STERILE IRR 1000ML POUR (IV SOLUTION) IMPLANT
YANKAUER SUCT BULB TIP NO VENT (SUCTIONS) ×4 IMPLANT

## 2021-08-17 ENCOUNTER — Encounter (HOSPITAL_COMMUNITY): Payer: Self-pay

## 2021-08-19 LAB — CULTURE, BLOOD (ROUTINE X 2)
Culture: NO GROWTH
Culture: NO GROWTH
Special Requests: ADEQUATE

## 2021-08-19 NOTE — Progress Notes (Signed)
Honor Ronita Hipps phone [facility] requesting chaplain prayer during tonight's Moment of Honor before [employer] at 6pm.  This chaplain will pass the request onto the [facility] chaplain.  Chaplain Stephanie Acre
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
## 2021-08-19 NOTE — Progress Notes (Addendum)
Patient was pronounced deceased at 22 by both Roselyn Bering RN and Neila Gear RN.  8 MG of Morphine was given via IV.
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
## 2021-08-19 DEATH — deceased

## 2021-08-21 LAB — SURGICAL PATHOLOGY
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
## 2021-09-19 DEATH — deceased
